# Patient Record
Sex: Female | Born: 1995 | Race: White | Hispanic: No | Marital: Married | Smoking: Never smoker
Health system: Southern US, Community
[De-identification: ages and names within clinical notes are randomized; demographics above are authoritative.]

## PROBLEM LIST (undated history)

## (undated) DIAGNOSIS — Z87898 Personal history of other specified conditions: Secondary | ICD-10-CM

## (undated) DIAGNOSIS — G43909 Migraine, unspecified, not intractable, without status migrainosus: Secondary | ICD-10-CM

## (undated) HISTORY — DX: Personal history of other specified conditions: Z87.898

## (undated) HISTORY — PX: WISDOM TOOTH EXTRACTION: SHX21

## (undated) HISTORY — DX: Migraine, unspecified, not intractable, without status migrainosus: G43.909

---

## 2017-08-17 ENCOUNTER — Encounter: Payer: Self-pay | Admitting: Family Medicine

## 2017-08-17 ENCOUNTER — Ambulatory Visit (INDEPENDENT_AMBULATORY_CARE_PROVIDER_SITE_OTHER): Payer: PRIVATE HEALTH INSURANCE | Admitting: Family Medicine

## 2017-08-17 VITALS — BP 112/68 | HR 97 | Temp 98.8°F | Resp 12 | Ht 67.5 in | Wt 176.9 lb

## 2017-08-17 DIAGNOSIS — L568 Other specified acute skin changes due to ultraviolet radiation: Secondary | ICD-10-CM | POA: Diagnosis not present

## 2017-08-17 DIAGNOSIS — Z833 Family history of diabetes mellitus: Secondary | ICD-10-CM | POA: Diagnosis not present

## 2017-08-17 DIAGNOSIS — J029 Acute pharyngitis, unspecified: Secondary | ICD-10-CM | POA: Diagnosis not present

## 2017-08-17 DIAGNOSIS — H04123 Dry eye syndrome of bilateral lacrimal glands: Secondary | ICD-10-CM

## 2017-08-17 DIAGNOSIS — J3081 Allergic rhinitis due to animal (cat) (dog) hair and dander: Secondary | ICD-10-CM

## 2017-08-17 DIAGNOSIS — Z8269 Family history of other diseases of the musculoskeletal system and connective tissue: Secondary | ICD-10-CM

## 2017-08-17 DIAGNOSIS — R0989 Other specified symptoms and signs involving the circulatory and respiratory systems: Secondary | ICD-10-CM

## 2017-08-17 LAB — POCT RAPID STREP A (OFFICE): Rapid Strep A Screen: NEGATIVE

## 2017-08-17 NOTE — Assessment & Plan Note (Signed)
Checking ANA

## 2017-08-17 NOTE — Assessment & Plan Note (Signed)
Offered basic testing here vs more definitive, thorough testing; she opted for basic testing

## 2017-08-17 NOTE — Progress Notes (Signed)
BP 112/68   Pulse 97   Temp 98.8 F (37.1 C) (Oral)   Resp 12   Ht 5' 7.5" (1.715 m)   Wt 176 lb 14.4 oz (80.2 kg)   SpO2 95%   BMI 27.30 kg/m    Subjective:    Patient ID: Meredith Kelly, female    DOB: Oct 23, 1995, 22 y.o.   MRN: [id number]  HPI: Meredith Kelly is a 22 y.o. female  Chief Complaint  Patient presents with  . New Patient (Initial Visit)  . URI    just got over sickness. symptoms include congestion, cough, irritation in eye, low grade fever.  Onset 3 days    HPI Patient is here to establish care Last month she got antibiotics First time on antibiotics since wisdom teeth Started working out recently Sleeping more when tired Childhood vaccines UTD Did not get any vaccines since age 22 No exposures with homeless, prison population  She has been concerned about diabetes; patient does not eat or drink a lot of sugary Strong family hx of diabetes, both type 1 and type 2; trying to avoid sugar No sodas, no sweets; no dry mouth, no real blurred vision; bad sensitivity to light; has not seen an eye doctor, but mother says she has been to three different eye doctor; all three said she needs OTC reading glasses  Family hx of lupus; she is sick if she gets around someone who is sick; swelling in lymph nodes under throat and under arms; toes turn purple and feel cold   She would like to have allergy testing done; allergic to animal dander  Depression screen PHQ 2/9 08/17/2017  Decreased Interest 0  Down, Depressed, Hopeless 0  PHQ - 2 Score 0   Relevant past medical, surgical, family and social history reviewed History reviewed. No pertinent past medical history.   Past Surgical History:  Procedure Laterality Date  . WISDOM TOOTH EXTRACTION     Family History  Problem Relation Age of Onset  . Lupus Maternal Grandmother   . Diabetes Maternal Grandmother   . Alzheimer's disease Paternal Grandfather    Social History   Tobacco Use  . Smoking status:  Never Smoker  . Smokeless tobacco: Never Used  Substance Use Topics  . Alcohol use: Never    Frequency: Never  . Drug use: Not Currently    Interim medical history since last visit reviewed. Allergies and medications reviewed  Review of Systems Per HPI unless specifically indicated above     Objective:    BP 112/68   Pulse 97   Temp 98.8 F (37.1 C) (Oral)   Resp 12   Ht 5' 7.5" (1.715 m)   Wt 176 lb 14.4 oz (80.2 kg)   SpO2 95%   BMI 27.30 kg/m   Wt Readings from Last 3 Encounters:  08/17/17 176 lb 14.4 oz (80.2 kg)    Physical Exam  Constitutional: She appears well-developed and well-nourished. No distress.  HENT:  Head: Normocephalic and atraumatic.  Right Ear: Ear canal normal. A middle ear effusion (dull) is present.  Left Ear: Tympanic membrane and ear canal normal.  Eyes: EOM are normal. Right eye exhibits no exudate. Left eye exhibits no exudate. Right conjunctiva is injected. Right conjunctiva has no hemorrhage. Left conjunctiva is not injected. Left conjunctiva has no hemorrhage. No scleral icterus.  Neck: No thyromegaly present.  Cardiovascular: Normal rate, regular rhythm and normal heart sounds.  No murmur heard. Toes cool and slightly violaceous, blanching after crossing  legs for a few minutes  Pulmonary/Chest: Effort normal and breath sounds normal. No respiratory distress. She has no wheezes.  Abdominal: Soft. Bowel sounds are normal. She exhibits no distension.  Musculoskeletal: Normal range of motion. She exhibits no edema.  Lymphadenopathy:    She has no cervical adenopathy.       Right cervical: No superficial cervical and no posterior cervical adenopathy present.      Left cervical: No superficial cervical and no posterior cervical adenopathy present.  Neurological: She is alert. She exhibits normal muscle tone.  Skin: Skin is warm and dry. She is not diaphoretic. No pallor.  Psychiatric: She has a normal mood and affect. Her behavior is normal.  Judgment and thought content normal. Her mood appears not anxious. She does not exhibit a depressed mood.    Results for orders placed or performed in visit on 08/17/17  POCT rapid strep A  Result Value Ref Range   Rapid Strep A Screen Negative Negative      Assessment & Plan:   Problem List Items Addressed This Visit      Cardiovascular and Mediastinum   Decreased circulation in fingers or toes    Checking ANA      Relevant Orders   ANA,IFA RA Diag Pnl w/rflx Tit/Patn     Respiratory   Allergic rhinitis due to animal hair and dander    Offered basic testing here vs more definitive, thorough testing; she opted for basic testing      Relevant Orders   Allergy Panel, Animal Group   Allergy Panel, Region 2, [place]asses    Other Visit Diagnoses    Acute pharyngitis, unspecified etiology    -  Primary   rapid strep negative; culture pending; energy is good, so doubt mono or CMV; will check CBC   Relevant Orders   POCT rapid strep A (Completed)   Culture, Group A Strep   CBC with Differential/Platelet   COMPLETE METABOLIC PANEL WITH GFR   Dry eyes       checking ANA; mouth does not appear dry, so will hold on anti-SSA, -SSB   Relevant Orders   Ambulatory referral to Ophthalmology   Photosensitivity       refer to ophthalmologist   Relevant Orders   Ambulatory referral to Ophthalmology   Family history of diabetes mellitus       check glucose   Family history of systemic lupus erythematosus       patient wants testing done, will get ANA with reflexes       Follow up plan: Return in about 2 months (around 10/18/2017) for complete physical.  An after-visit summary was printed and given to the patient at check-out.  Please see the patient instructions which may contain other information and recommendations beyond what is mentioned above in the assessment and plan.  No orders of the defined types were placed in this encounter.   Orders Placed This Encounter  Procedures    . Culture, Group A Strep  . CBC with Differential/Platelet  . COMPLETE METABOLIC PANEL WITH GFR  . ANA,IFA RA Diag Pnl w/rflx Tit/Patn  . Allergy Panel, Animal Group  . Allergy Panel, Region 2, [place]  . Ambulatory referral to Ophthalmology  . POCT rapid strep A

## 2017-08-17 NOTE — Patient Instructions (Addendum)
We'll get labs today If you have not heard anything from my staff in a week about any orders/referrals/studies from today, please contact us here to follow-up [phone]  Pharyngitis Pharyngitis is a sore throat (pharynx). There is redness, pain, and swelling of your throat. Follow these instructions at home:  Drink enough fluids to keep your pee (urine) clear or pale yellow.  Only take medicine as told by your doctor. ? You may get sick again if you do not take medicine as told. Finish your medicines, even if you start to feel better. ? Do not take aspirin.  Rest.  Rinse your mouth (gargle) with salt water ( tsp of salt per 1 qt of water) every 1-2 hours. This will help the pain.  If you are not at risk for choking, you can suck on hard candy or sore throat lozenges. Contact a doctor if:  You have large, tender lumps on your neck.  You have a rash.  You cough up green, yellow-brown, or bloody spit. Get help right away if:  You have a stiff neck.  You drool or cannot swallow liquids.  You throw up (vomit) or are not able to keep medicine or liquids down.  You have very bad pain that does not go away with medicine.  You have problems breathing (not from a stuffy nose). This information is not intended to replace advice given to you by your health care provider. Make sure you discuss any questions you have with your health care provider. Document Released: 06/24/2007 Document Revised: 06/13/2015 Document Reviewed: 09/12/2012 Elsevier Interactive Patient Education  2017 [employer]

## 2017-08-20 LAB — CBC WITH DIFFERENTIAL/PLATELET
Basophils Absolute: 42 cells/uL (ref 0–200)
Basophils Relative: 0.4 %
EOS PCT: 0.8 %
Eosinophils Absolute: 85 cells/uL (ref 15–500)
HCT: 41.3 % (ref 35.0–45.0)
HEMOGLOBIN: 13.5 g/dL (ref 11.7–15.5)
Lymphs Abs: 2544 cells/uL (ref 850–3900)
MCH: 28.1 pg (ref 27.0–33.0)
MCHC: 32.7 g/dL (ref 32.0–36.0)
MCV: 86 fL (ref 80.0–100.0)
MONOS PCT: 6 %
MPV: 9.4 fL (ref 7.5–12.5)
NEUTROS ABS: 7293 {cells}/uL (ref 1500–7800)
Neutrophils Relative %: 68.8 %
Platelets: 302 10*3/uL (ref 140–400)
RBC: 4.8 10*6/uL (ref 3.80–5.10)
RDW: 12.9 % (ref 11.0–15.0)
Total Lymphocyte: 24 %
WBC mixed population: 636 cells/uL (ref 200–950)
WBC: 10.6 10*3/uL (ref 3.8–10.8)

## 2017-08-20 LAB — ALLERGY PANEL, REGION 2, GRASSES
CLASS: 0
CLASS: 0
CLASS: 0
CLASS: 0
Class: 0
G005 Rye, Perennial: <0.1 kU/L
ORCHARD GRASS (COCKSFOOT) (G3) IGE: <0.1 kU/L

## 2017-08-20 LAB — COMPLETE METABOLIC PANEL WITH GFR
AG RATIO: 1.7 (calc) (ref 1.0–2.5)
ALKALINE PHOSPHATASE (APISO): 77 U/L (ref 33–115)
ALT: 14 U/L (ref 6–29)
AST: 13 U/L (ref 10–30)
Albumin: 4.5 g/dL (ref 3.6–5.1)
BILIRUBIN TOTAL: 0.4 mg/dL (ref 0.2–1.2)
BUN: 8 mg/dL (ref 7–25)
CHLORIDE: 103 mmol/L (ref 98–110)
CO2: 30 mmol/L (ref 20–32)
Calcium: 10 mg/dL (ref 8.6–10.2)
Creat: 0.73 mg/dL (ref 0.50–1.10)
GFR, EST AFRICAN AMERICAN: 137 mL/min/{1.73_m2} (ref >=60)
GFR, Est Non African American: 119 mL/min/{1.73_m2} (ref >=60)
Globulin: 2.6 g/dL (calc) (ref 1.9–3.7)
Glucose, Bld: 97 mg/dL (ref 65–139)
POTASSIUM: 4 mmol/L (ref 3.5–5.3)
Sodium: 139 mmol/L (ref 135–146)
TOTAL PROTEIN: 7.1 g/dL (ref 6.1–8.1)

## 2017-08-20 LAB — ALLERGY PANEL, ANIMAL GROUP
Allergen,Goose feathers, e70: <0.1 kU/L
CLASS: 0
CLASS: 0
CLASS: 0
CLASS: 0
Class: 0
Cow Dander IgE: <0.1 kU/L
Horse dander: <0.1 kU/L

## 2017-08-20 LAB — CULTURE, GROUP A STREP
MICRO NUMBER:: 90901717
SPECIMEN QUALITY: ADEQUATE

## 2017-08-20 LAB — ANTI-NUCLEAR AB-TITER (ANA TITER)

## 2017-08-20 LAB — ANA,IFA RA DIAG PNL W/RFLX TIT/PATN
ANA: POSITIVE — AB
Cyclic Citrullin Peptide Ab: <16 UNITS
Rhuematoid fact SerPl-aCnc: <14 IU/mL (ref <14)

## 2017-08-20 LAB — INTERPRETATION:

## 2017-08-27 ENCOUNTER — Telehealth: Payer: Self-pay | Admitting: Family Medicine

## 2017-08-27 DIAGNOSIS — R7689 Other specified abnormal immunological findings in serum: Secondary | ICD-10-CM

## 2017-08-27 DIAGNOSIS — H04123 Dry eye syndrome of bilateral lacrimal glands: Secondary | ICD-10-CM

## 2017-08-27 DIAGNOSIS — R768 Other specified abnormal immunological findings in serum: Secondary | ICD-10-CM | POA: Insufficient documentation

## 2017-08-27 DIAGNOSIS — R0989 Other specified symptoms and signs involving the circulatory and respiratory systems: Secondary | ICD-10-CM

## 2017-08-27 NOTE — Telephone Encounter (Signed)
I talked with patient about final labs; ANA positive, two different patterns; dry eyes, may be sjogrens, refer to rheum for full work-up other testing

## 2017-08-27 NOTE — Assessment & Plan Note (Signed)
Refer to rheum 

## 2017-09-09 ENCOUNTER — Emergency Department
Admission: EM | Admit: 2017-09-09 | Discharge: 2017-09-09 | Disposition: A | Discharge status: Home / Self Care | Payer: Self-pay | Attending: Emergency Medicine | Admitting: Emergency Medicine

## 2017-09-09 ENCOUNTER — Emergency Department: Payer: Self-pay

## 2017-09-09 ENCOUNTER — Encounter: Payer: Self-pay | Admitting: Family Medicine

## 2017-09-09 ENCOUNTER — Ambulatory Visit (INDEPENDENT_AMBULATORY_CARE_PROVIDER_SITE_OTHER): Payer: PRIVATE HEALTH INSURANCE | Admitting: Family Medicine

## 2017-09-09 ENCOUNTER — Ambulatory Visit: Payer: Self-pay | Admitting: *Deleted

## 2017-09-09 VITALS — BP 104/68 | HR 135 | Temp 100.6°F | Ht 67.5 in | Wt 176.0 lb

## 2017-09-09 DIAGNOSIS — R509 Fever, unspecified: Secondary | ICD-10-CM

## 2017-09-09 DIAGNOSIS — R000 Tachycardia, unspecified: Secondary | ICD-10-CM

## 2017-09-09 DIAGNOSIS — N390 Urinary tract infection, site not specified: Secondary | ICD-10-CM | POA: Insufficient documentation

## 2017-09-09 DIAGNOSIS — Z79899 Other long term (current) drug therapy: Secondary | ICD-10-CM | POA: Insufficient documentation

## 2017-09-09 LAB — CBC WITH DIFFERENTIAL/PLATELET
Basophils Absolute: 0.1 10*3/uL (ref 0–0.1)
Basophils Relative: 0 %
EOS PCT: 0 %
Eosinophils Absolute: 0 10*3/uL (ref 0–0.7)
HCT: 38.9 % (ref 35.0–47.0)
Hemoglobin: 13 g/dL (ref 12.0–16.0)
LYMPHS ABS: 1.2 10*3/uL (ref 1.0–3.6)
Lymphocytes Relative: 6 %
MCH: 28.6 pg (ref 26.0–34.0)
MCHC: 33.5 g/dL (ref 32.0–36.0)
MCV: 85.5 fL (ref 80.0–100.0)
MONOS PCT: 7 %
Monocytes Absolute: 1.4 10*3/uL — ABNORMAL HIGH (ref 0.2–0.9)
NEUTROS PCT: 87 %
Neutro Abs: 18.7 10*3/uL — ABNORMAL HIGH (ref 1.4–6.5)
Platelets: 358 10*3/uL (ref 150–440)
RBC: 4.55 MIL/uL (ref 3.80–5.20)
RDW: 14.3 % (ref 11.5–14.5)
WBC: 21.4 10*3/uL — AB (ref 3.6–11.0)

## 2017-09-09 LAB — URINALYSIS, COMPLETE (UACMP) WITH MICROSCOPIC
BILIRUBIN URINE: NEGATIVE
Glucose, UA: NEGATIVE mg/dL
KETONES UR: NEGATIVE mg/dL
NITRITE: POSITIVE — AB
PROTEIN: NEGATIVE mg/dL
Specific Gravity, Urine: 1.008 (ref 1.005–1.030)
WBC, UA: >50 WBC/hpf — ABNORMAL HIGH (ref 0–5)
pH: 6 (ref 5.0–8.0)

## 2017-09-09 LAB — PROTIME-INR
INR: 1.04
Prothrombin Time: 13.5 seconds (ref 11.4–15.2)

## 2017-09-09 LAB — COMPREHENSIVE METABOLIC PANEL
ALBUMIN: 4.4 g/dL (ref 3.5–5.0)
ALK PHOS: 67 U/L (ref 38–126)
ALT: 24 U/L (ref 0–44)
AST: 24 U/L (ref 15–41)
Anion gap: 9 (ref 5–15)
BUN: 13 mg/dL (ref 6–20)
CHLORIDE: 103 mmol/L (ref 98–111)
CO2: 24 mmol/L (ref 22–32)
CREATININE: 0.88 mg/dL (ref 0.44–1.00)
Calcium: 9.7 mg/dL (ref 8.9–10.3)
GFR calc non Af Amer: >60 mL/min (ref >60)
GLUCOSE: 105 mg/dL — AB (ref 70–99)
Potassium: 3.5 mmol/L (ref 3.5–5.1)
SODIUM: 136 mmol/L (ref 135–145)
Total Bilirubin: 1 mg/dL (ref 0.3–1.2)
Total Protein: 8.5 g/dL — ABNORMAL HIGH (ref 6.5–8.1)

## 2017-09-09 LAB — LACTIC ACID, PLASMA
Lactic Acid, Venous: 1.1 mmol/L (ref 0.5–1.9)
Lactic Acid, Venous: 2.1 mmol/L (ref 0.5–1.9)

## 2017-09-09 LAB — HCG, QUANTITATIVE, PREGNANCY

## 2017-09-09 MED ORDER — CEPHALEXIN 500 MG PO CAPS: 500.0000 mg | ORAL_CAPSULE | Freq: Two times a day (BID) | ORAL | 0 refills | Status: AC

## 2017-09-09 MED ORDER — ACETAMINOPHEN 325 MG PO TABS
650.0000 mg | ORAL_TABLET | Freq: Once | ORAL | Status: AC | PRN
  Administered 2017-09-09: 650 mg via ORAL
  Filled 2017-09-09: qty 2

## 2017-09-09 MED ORDER — SODIUM CHLORIDE 0.9 % IV BOLUS
1000.0000 mL | Freq: Once | INTRAVENOUS | Status: AC
  Administered 2017-09-09: 1000 mL via INTRAVENOUS

## 2017-09-09 MED ORDER — SODIUM CHLORIDE 0.9 % IV SOLN
1.0000 g | Freq: Once | INTRAVENOUS | Status: AC
  Administered 2017-09-09: 1 g via INTRAVENOUS
  Filled 2017-09-09: qty 10

## 2017-09-09 NOTE — ED Triage Notes (Signed)
Pt was seen at her PCP about a mth ago for the same thing, they did blood work then. Pt ANA was positive for 2 traits of autoimmune. Pt has an appt with Rheumatoid MD next thursday 09/16/17. This is the 3rd this pt has had a fever in the last 20 days. Pt denies any pain at this time.

## 2017-09-09 NOTE — ED Provider Notes (Signed)
[facility] Emergency Department Provider Note ____________________________________________   First MD Initiated Contact with Patient 09/09/17 [phone]     (approximate)  I have reviewed the triage vital signs and the nursing notes.   HISTORY  Chief Complaint Fever    HPI Meredith Kelly is a 22 y.o. female with no significant past medical history who presents with fever, gradual onset today, and measured to as high as 104 at home.  The patient has had some fatigue and feels slightly weak, but denies any other focal symptoms.  She went to see her primary care doctor and was sent to the emergency department because her heart rate was in the 150s and there was apparent concern for sepsis.  The patient reports that she has had intermittent low-grade fevers over about the last year, with some higher fevers in the last several weeks.  She is currently being worked up for autoimmune disease and had a positive ANA recently.  She has been referred to see a rheumatologist.  No past medical history on file.  Patient Active Problem List   Diagnosis Date Noted  . ANA positive 08/27/2017  . Dry eyes 08/27/2017  . Decreased circulation in fingers or toes 08/17/2017  . Allergic rhinitis due to animal hair and dander 08/17/2017    Past Surgical History:  Procedure Laterality Date  . WISDOM TOOTH EXTRACTION      Prior to Admission medications   Medication Sig Start Date End Date Taking? Authorizing Provider  cetirizine (ZYRTEC) 10 MG tablet Take 10 mg by mouth daily as needed for allergies.   Yes [provider]  Cranberry 425 MG CAPS Take 1 capsule by mouth daily.   Yes [provider]  etonogestrel (NEXPLANON) 68 MG IMPL implant 1 each by Subdermal route once.   Yes [provider]  GARLIC PO Take 1 Dose by mouth daily.   Yes [provider]  Multiple Vitamin (MULTIVITAMIN WITH MINERALS) TABS tablet Take 1 tablet by mouth daily.   Yes  [provider]  cephALEXin (KEFLEX) 500 MG capsule Take 1 capsule (500 mg total) by mouth 2 (two) times daily for 10 days. 09/10/17 09/20/17  Dionne BucySiadecki, Aarin Bluett, MD    Allergies Patient has no known allergies.  Family History  Problem Relation Age of Onset  . Lupus Maternal Grandmother   . Diabetes Maternal Grandmother   . Alzheimer's disease Paternal Grandfather     Social History Social History   Tobacco Use  . Smoking status: Never Smoker  . Smokeless tobacco: Never Used  Substance Use Topics  . Alcohol use: Never    Frequency: Never  . Drug use: Not Currently    Review of Systems Constitutional: Positive for fever. Eyes: No redness. ENT: No sore throat. Cardiovascular: Denies chest pain. Respiratory: Denies shortness of breath or cough. Gastrointestinal: No vomiting.  No diarrhea.  Genitourinary: Negative for dysuria.  Musculoskeletal: Negative for back pain. Skin: Negative for rash. Neurological: Negative for headache.   ____________________________________________   PHYSICAL EXAM:  VITAL SIGNS: ED Triage Vitals  Enc Vitals Group     BP 09/09/17 1558 131/76     Pulse Rate 09/09/17 1554 (!) 129     Resp --      Temp 09/09/17 1554 (!) 101.4 F (38.6 C)     Temp Source 09/09/17 1554 Oral     SpO2 09/09/17 1554 99 %     Weight 09/09/17 1555 175 lb 15.9 oz (79.8 kg)  Height 09/09/17 1555 5\' 7"  (1.702 m)     Head Circumference --      Peak Flow --      Pain Score 09/09/17 1555 0     Pain Loc --      Pain Edu? --      Excl. in GC? --     Constitutional: Alert and oriented. Well appearing and in no acute distress. Eyes: Conjunctivae are normal.  Head: Atraumatic. Nose: No congestion/rhinnorhea. Mouth/Throat: Mucous membranes are slightly dry.   Neck: Normal range of motion.  Cardiovascular: Tachycardic, regular rhythm. Grossly normal heart sounds.  Good peripheral circulation. Respiratory: Normal respiratory effort.  No retractions. Lungs  CTAB. Gastrointestinal: Soft and nontender. No distention.  Genitourinary: No flank tenderness. Musculoskeletal: No lower extremity edema.  Extremities warm and well perfused.  Neurologic:  Normal speech and language. No gross focal neurologic deficits are appreciated.  Skin:  Skin is warm and dry. No rash noted. Psychiatric: Mood and affect are normal. Speech and behavior are normal.  ____________________________________________   LABS (all labs ordered are listed, but only abnormal results are displayed)  Labs Reviewed  COMPREHENSIVE METABOLIC PANEL - Abnormal; Notable for the following components:      Result Value   Glucose, Bld 105 (*)    Total Protein 8.5 (*)    All other components within normal limits  LACTIC ACID, PLASMA - Abnormal; Notable for the following components:   Lactic Acid, Venous 2.1 (*)    All other components within normal limits  CBC WITH DIFFERENTIAL/PLATELET - Abnormal; Notable for the following components:   WBC 21.4 (*)    Neutro Abs 18.7 (*)    Monocytes Absolute 1.4 (*)    All other components within normal limits  URINALYSIS, COMPLETE (UACMP) WITH MICROSCOPIC - Abnormal; Notable for the following components:   Color, Urine YELLOW (*)    APPearance HAZY (*)    Hgb urine dipstick SMALL (*)    Nitrite POSITIVE (*)    Leukocytes, UA LARGE (*)    WBC, UA >50 (*)    Bacteria, UA MANY (*)    All other components within normal limits  GROUP A STREP BY PCR  CULTURE, BLOOD (ROUTINE X 2)  CULTURE, BLOOD (ROUTINE X 2)  PROTIME-INR  HCG, QUANTITATIVE, PREGNANCY  LACTIC ACID, PLASMA  POC URINE PREG, ED   ____________________________________________  EKG  ED ECG REPORT I, Dionne Bucy, the attending physician, personally viewed and interpreted this ECG.  Date: 09/09/2017 EKG Time: 1621 Rate: 108 Rhythm: Sinus tachycardia QRS Axis: normal Intervals: normal ST/T Wave abnormalities: normal Narrative Interpretation: no evidence of acute  ischemia  ____________________________________________  RADIOLOGY  CXR: No focal infiltrate or other acute abnormality  ____________________________________________   PROCEDURES  Procedure(s) performed: No  Procedures  Critical Care performed: No ____________________________________________   INITIAL IMPRESSION / ASSESSMENT AND PLAN / ED COURSE  Pertinent labs & imaging results that were available during my care of the patient were reviewed by me and considered in my medical decision making (see chart for details).  22 year old female with no significant past medical history (but with positive ANA and currently being worked up for possible autoimmune disease) presents with fever today, to as high as 104, with associated tachycardia.  The patient reports some fatigue and aches, but denies any other focal symptoms to suggest a source of infection.  She has had similar fevers several times recently.  I reviewed the past medical records including PCP Dr. Marlise Eves recent notes, and confirmed  the history of positive ANA and referral to rheumatology.  On exam, the patient is actually quite well-appearing.  She is febrile and slightly tachycardic, but her other vital signs are normal.  The remainder of the exam is unremarkable.  The primary differential would be viral syndrome, versus some type of autoimmune etiology.  I suspect that the tachycardia is primarily a response to the fever rather than dehydration or sepsis.  However, given her abnormal vital signs we will obtain full infection/sepsis work-up and give fluids.  If the work-up is entirely negative and the patient's vital signs improved, anticipate discharge home with outpatient follow-up.  ----------------------------------------- 9:57 PM on 09/09/2017 -----------------------------------------  The patient's initial work-up revealed elevated WBC count, and minimally elevated lactate of 2.1.  Her vital signs improved after Tylenol  and fluids.  Chest x-ray was negative,, but the UA is consistent with UTI.  Although the patient has no significant urinary symptoms, given that she has numerous WBCs, as well as nitrites and leukocytes, this is consistent with infection and I suspect the most likely etiology of her febrile illness.  We gave additional fluids and observe the patient.  Repeat lactate was 1.1 (the second lactate was actually resulted as an earlier sample, which is incorrect).   The patient feels much better and would like to go home.  Given that her vital signs have significantly improved, there is no evidence of active sepsis, and we have found a source of infection, she is appropriate for discharge home.  We gave a dose of ceftriaxone in the ED.  I will continue the patient on Keflex at home.  She has follow-up with rheumatology next week.  I also instructed her to follow-up with her primary care doctor.  I had an extensive discussion about the results of the work-up and the plan of care.  Return precautions given, and she and her mother expressed understanding. ____________________________________________   FINAL CLINICAL IMPRESSION(S) / ED DIAGNOSES  Final diagnoses:  Urinary tract infection without hematuria, site unspecified  Febrile illness      NEW MEDICATIONS STARTED DURING THIS VISIT:  New Prescriptions   CEPHALEXIN (KEFLEX) 500 MG CAPSULE    Take 1 capsule (500 mg total) by mouth 2 (two) times daily for 10 days.     Note:  This document was prepared using Dragon voice recognition software and may include unintentional dictation errors.    Dionne Bucy, MD 09/09/17 2200

## 2017-09-09 NOTE — ED Notes (Signed)
Received call from X-ray that patient refused. MD notified.

## 2017-09-09 NOTE — ED Triage Notes (Signed)
Pt presents for fever from [facility] for fever and hr. Pt states there her HR was 154 standing. Pt is NAD A/O at this time with mother present.

## 2017-09-09 NOTE — Progress Notes (Signed)
Name: Meredith Kelly   MRN: [id number]    DOB: 10-Feb-1995   Date:09/09/2017       Progress Note  Subjective  Chief Complaint  Chief Complaint  Patient presents with  . Fever  . Tachycardia    HPI  Pt presents to clinic with concern for fever - she finished her college classes this afternoon, walked outside, and started to feel warm/fatigued.  Took her temperature and it was 101F, took some tylenol, rechecked her temp and it was 103F, 1 hour later is was 104F, so she took advil and came to our clinic for evaluation.  Temp Is 100.6 in clinic today.  She denies any other symptoms and states she does not feel badly aside from feeling like her heart is racing and some fatigue.    She did have fever and acute non-streptococcal pharyngitis about 20 days ago when she established care with our clinic with PCP Dr. Sherie Kelly. The labs from that visit are reviewed - she had postiive ANA, and normal CBC and CMP.    Patient Active Problem List   Diagnosis Date Noted  . ANA positive 08/27/2017  . Dry eyes 08/27/2017  . Decreased circulation in fingers or toes 08/17/2017  . Allergic rhinitis due to animal hair and dander 08/17/2017    Social History   Tobacco Use  . Smoking status: Never Smoker  . Smokeless tobacco: Never Used  Substance Use Topics  . Alcohol use: Never    Frequency: Never    No current outpatient medications on file.  No Known Allergies  ROS  Constitutional: Positive for fever; weight change.  HEENT: No throat pain, ear pain, or sinus congestion Respiratory: Negative for cough and shortness of breath.   Cardiovascular: Negative for chest pain or palpitations.  Gastrointestinal: Negative for abdominal pain, no bowel changes.  Musculoskeletal: Negative for gait problem or joint swelling.  Skin: Negative for rash.  Neurological: Negative for dizziness or headache.  Urologic: No flank pain, back pain, dysuria, urinary frequency or urgency No other specific complaints in  a complete review of systems (except as listed in HPI above).   Objective  Vitals:   09/09/17 1508  BP: 104/68  Pulse: (!) 135  Temp: (!) 100.6 F (38.1 C)  SpO2: 99%  Weight: 176 lb (79.8 kg)  Height: 5' 7.5" (1.715 m)   HR is 140-145 auscultated  Body mass index is 27.16 kg/m.  Nursing Note and Vital Signs reviewed.  Physical Exam  Constitutional: Patient appears well-developed and well-nourished. No distress.  HENT: Head: Normocephalic and atraumatic. Ears: B TMs ok, no erythema or effusion; Nose: Nose normal. Mouth/Throat: Oropharynx is clear and moist. No oropharyngeal exudate.  Eyes: Conjunctivae and EOM are normal. Pupils are equal, round, and reactive to light. No scleral icterus.  Neck: Normal range of motion. Neck with normal AROM. No JVD present.  Cardiovascular: Normal rate, regular rhythm and normal heart sounds.  No murmur heard. No BLE edema. Pulmonary/Chest: Effort normal and breath sounds normal. No respiratory distress. Musculoskeletal: Normal range of motion, no joint effusions. No gross deformities Neurological: she is alert and oriented to person, place, and time. No cranial nerve deficit. Coordination, balance, strength, speech and gait are normal.  Skin: Skin is warm and dry. No rash noted. No erythema.  Psychiatric: Patient has a normal mood and affect. behavior is normal. Judgment and thought content normal.  No results found for this or any previous visit (from the past 72 hour(s)).  Assessment & Plan  1. Fever of unknown origin (FUO) 2. Tachycardia - There is no obvious source of infection, and patient's heart rate is notably elevated. I advised she needs to present to the ER for further evaluation.  She is agreeable but declines EMS transport - has a family friend who will drive her.  - PCP Dr. Sherie Kelly is made aware of patient presentation and plan and is agreeable.

## 2017-09-09 NOTE — ED Notes (Signed)
First encounter with patient. Patient resting in bed. Monitor in place. IV fluids infusing.

## 2017-09-09 NOTE — Telephone Encounter (Signed)
Pt reports temp at 1200 noon 101.0; states took tylenol and Ibuprofen at that time.. At 1345 temp 103.0. Pt states then used ice packs; Temp at 1410... 104.2. Temp obtained by oral digital thermometer. Denies any symptoms other than chills; some intermittent nausea "When first went outside into the heat."  No vomiting. Per Dr. Marlise EvesLada's note of 08/27/17,  pt. ANA positive. Pt has an appt with rheumatologist next week. NT placed call to practice; questioned disposition of UC/ED or appropriate for pt to be seen in office; spoke with Cassandra.  Appt secured with Dr. Sherie DonLada for 1500 this afternoon. Care advise given per protocol.  Reason for Disposition . Fever > 104 F (40 C)  Answer Assessment - Initial Assessment Questions 1. TEMPERATURE: "What is the most recent temperature?"  "How was it measured?"      104.2 digital thermometer. 2. ONSET: "When did the fever start?"     1200 noon 3. SYMPTOMS: "Do you have any other symptoms besides the fever?"  (e.g., colds, headache, sore throat, earache, cough, rash, diarrhea, vomiting, abdominal pain)     no 4. CAUSE: If there are no symptoms, ask: "What do you think is causing the fever?"      Unsure 5. CONTACTS: "Does anyone else in the family have an infection?"     no 6. TREATMENT: "What have you done so far to treat this fever?" (e.g., medications)     Tylenol, ice packs, Ibuprofen. 7. IMMUNOCOMPROMISE: "Do you have of the following: diabetes, HIV positive, splenectomy, cancer chemotherapy, chronic steroid treatment, transplant patient, etc."  Protocols used: FEVER-A-AH

## 2017-09-09 NOTE — Discharge Instructions (Addendum)
Take the antibiotic as prescribed and finish the full course.  You can continue to take ibuprofen or Tylenol as needed for fever.  Follow-up with the rheumatologist next week as scheduled, and with your primary care doctor.  Return to the ER for new, worsening, or persistent high fever, weakness, vomiting or inability to take the medication, abdominal or flank pain, or any other new or worsening symptoms that concern you.

## 2017-09-09 NOTE — ED Notes (Signed)
ED Provider at bedside. 

## 2017-09-09 NOTE — Telephone Encounter (Signed)
Patient sent to the ER

## 2017-09-09 NOTE — ED Notes (Signed)
Pt refused Strep test. States that she doesn't feel like she needs it and that she has been tested for it in the past. MD at bedside.

## 2017-09-09 NOTE — ED Notes (Signed)
Pt given fluid to drink. Pt c/o pain at the iv site on the right arm. Pt says it feels like its stabbing her arm. Offerred to pull the iv and we could restick if she needed the 2nd line. Mom stated that they would wait. No fluids infusing in that line at this time. Pt is laying flat on her back and wont move her arms. Mom is giving pt water through a straw.

## 2017-09-10 LAB — BLOOD CULTURE ID PANEL (REFLEXED)
ACINETOBACTER BAUMANNII: NOT DETECTED
CANDIDA GLABRATA: NOT DETECTED
CANDIDA KRUSEI: NOT DETECTED
CANDIDA PARAPSILOSIS: NOT DETECTED
Candida albicans: NOT DETECTED
Candida tropicalis: NOT DETECTED
ENTEROBACTER CLOACAE COMPLEX: NOT DETECTED
ESCHERICHIA COLI: NOT DETECTED
Enterobacteriaceae species: NOT DETECTED
Enterococcus species: NOT DETECTED
Haemophilus influenzae: NOT DETECTED
KLEBSIELLA OXYTOCA: NOT DETECTED
Klebsiella pneumoniae: NOT DETECTED
Listeria monocytogenes: NOT DETECTED
Methicillin resistance: NOT DETECTED
Neisseria meningitidis: NOT DETECTED
Proteus species: NOT DETECTED
Pseudomonas aeruginosa: NOT DETECTED
SERRATIA MARCESCENS: NOT DETECTED
STAPHYLOCOCCUS AUREUS BCID: NOT DETECTED
STREPTOCOCCUS PNEUMONIAE: NOT DETECTED
STREPTOCOCCUS PYOGENES: NOT DETECTED
Staphylococcus species: DETECTED — AB
Streptococcus agalactiae: NOT DETECTED
Streptococcus species: NOT DETECTED

## 2017-09-10 NOTE — ED Provider Notes (Signed)
Patient with 1 out of 4 positive blood culture, staph species, likely contaminant.  Spoke with patient who reports she is feeling better, she is resting at home and will be picking up her antibiotics soon.  She knows to return if any worsening of her symptoms   Jene EveryKinner, Lillie Bollig, MD 09/10/17 1103

## 2017-09-11 LAB — CULTURE, BLOOD (ROUTINE X 2)

## 2017-09-14 LAB — CULTURE, BLOOD (ROUTINE X 2)
CULTURE: NO GROWTH
Special Requests: ADEQUATE

## 2017-10-06 ENCOUNTER — Other Ambulatory Visit: Payer: Self-pay | Admitting: Internal Medicine

## 2017-10-06 DIAGNOSIS — R509 Fever, unspecified: Secondary | ICD-10-CM

## 2017-10-14 ENCOUNTER — Ambulatory Visit: Payer: Medicaid Other

## 2017-10-15 ENCOUNTER — Encounter: Payer: PRIVATE HEALTH INSURANCE | Admitting: Family Medicine

## 2017-10-26 ENCOUNTER — Ambulatory Visit
Admission: RE | Admit: 2017-10-26 | Discharge: 2017-10-26 | Disposition: A | Discharge status: Home / Self Care | Payer: Medicaid Other | Source: Ambulatory Visit | Attending: Internal Medicine | Admitting: Internal Medicine

## 2017-11-16 ENCOUNTER — Encounter: Payer: PRIVATE HEALTH INSURANCE | Admitting: Family Medicine

## 2017-11-16 ENCOUNTER — Ambulatory Visit (INDEPENDENT_AMBULATORY_CARE_PROVIDER_SITE_OTHER): Payer: PRIVATE HEALTH INSURANCE | Admitting: Nurse Practitioner

## 2017-11-16 ENCOUNTER — Encounter: Payer: Self-pay | Admitting: Nurse Practitioner

## 2017-11-16 VITALS — BP 132/80 | HR 90 | Temp 99.0°F | Resp 16 | Ht 66.0 in | Wt 174.3 lb

## 2017-11-16 DIAGNOSIS — Z0000 Encounter for general adult medical examination without abnormal findings: Secondary | ICD-10-CM | POA: Diagnosis not present

## 2017-11-16 DIAGNOSIS — Z789 Other specified health status: Secondary | ICD-10-CM | POA: Diagnosis not present

## 2017-11-16 LAB — POCT URINALYSIS DIPSTICK
APPEARANCE: NORMAL
BILIRUBIN UA: NEGATIVE
Glucose, UA: NEGATIVE
KETONES UA: NEGATIVE
Leukocytes, UA: NEGATIVE
Nitrite, UA: NEGATIVE
Odor: NORMAL
PH UA: 6 (ref 5.0–8.0)
Protein, UA: NEGATIVE
RBC UA: NEGATIVE
SPEC GRAV UA: 1.02 (ref 1.010–1.025)
UROBILINOGEN UA: 0.2 U/dL

## 2017-11-16 NOTE — Patient Instructions (Signed)
General recommendations: 150 minutes of physical activity weekly, eat two servings of fish weekly, eat one serving of tree nuts ( cashews, pistachios, pecans, almonds..) every other day, eat 6 servings of fruit/vegetables daily and drink plenty of water and avoid sweet beverages.   

## 2017-11-16 NOTE — Progress Notes (Signed)
Name: Meredith Kelly   MRN: [id number]    DOB: 01-12-1996   Date:11/16/2017       Progress Note  Subjective  Chief Complaint  Chief Complaint  Patient presents with  . Annual Exam    HPI  Patient presents for annual CPE.  Last month patient was admitted for severe UTI and almost septic, patient states had foul odor to urine but no other urinary symptoms would like urine screened today. She sees Dr. Annalee Genta for positive ANA, states she was referred to infectious disease by her but was overwhelmed by seeing so many providers and her hospitalization that she hasn't followed up and is feeling much improved.    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 Does not some fatigue states during the week that resolves on the weekends. States that she doesn't get much sleep during the week days. Has good social support, enjoys her job and school but is more stressed with schooling because it is her senior year.   Diet:  Eats 3 times a day,  Breakfast- yogurt, granola Lunch time- eats out Dinner- home-cooked meat and vegetables 3 servings of vegetables a day; 4-5 servings fruits a day Drinks water- 5-6 bottles of water of day Exercise:  3 times a week, an hour- cardio and some pushups and sit up USPSTF grade A and B recommendations    Office Visit from 11/16/2017 in [facility]  AUDIT-C Score  0     Depression:  Depression screen [facility] 2/9 11/16/2017 08/17/2017  Decreased Interest 0 0  Down, Depressed, Hopeless 0 0  PHQ - 2 Score 0 0   Hypertension: BP Readings from Last 3 Encounters:  11/16/17 132/80  09/09/17 118/75  09/09/17 104/68   Obesity: Wt Readings from Last 3 Encounters:  11/16/17 174 lb 4.8 oz (79.1 kg)  09/09/17 175 lb 15.9 oz (79.8 kg)  09/09/17 176 lb (79.8 kg)   BMI Readings from Last 3 Encounters:  11/16/17 28.13 kg/m  09/09/17  27.56 kg/m  09/09/17 27.16 kg/m    Hep C Screening:  Declines  STD testing and prevention (HIV/chl/gon/syphilis): declines  Intimate partner violence: denies  Sexual History/Pain during Intercourse: denies Menstrual History/LMP/Abnormal Bleeding: has impant; hadnt had a period for over a year and currently having period   Breast cancer:  No family history   Cervical cancer screening: age 22   Glucose:  Glucose, Bld  Date Value Ref Range Status  09/09/2017 105 (H) 70 - 99 mg/dL Final  08/17/2017 97 65 - 139 mg/dL Final    Comment:    .        Non-fasting reference interval .     Skin cancer:    Patient Active Problem List   Diagnosis Date Noted  . ANA positive 08/27/2017  . Dry eyes 08/27/2017  . Decreased circulation in fingers or toes 08/17/2017  . Allergic rhinitis due to animal hair and dander 08/17/2017    Past Surgical History:  Procedure Laterality Date  . WISDOM TOOTH EXTRACTION      Family History  Problem Relation Age of Onset  . Lupus Maternal Grandmother   . Diabetes Maternal Grandmother   . Alzheimer's disease Paternal Grandfather     Social History   Socioeconomic History  . Marital status: Single    Spouse name: Not on file  . Number of children: Not on  file  . Years of education: Not on file  . Highest education level: Not on file  Occupational History  . Not on file  Social Needs  . Financial resource strain: Somewhat hard  . Food insecurity:    Worry: Never true    Inability: Never true  . Transportation needs:    Medical: No    Non-medical: No  Tobacco Use  . Smoking status: Never Smoker  . Smokeless tobacco: Never Used  Substance and Sexual Activity  . Alcohol use: Never    Frequency: Never  . Drug use: Not Currently  . Sexual activity: Not Currently  Lifestyle  . Physical activity:    Days per week: 3 days    Minutes per session: 60 min  . Stress: Rather much  Relationships  . Social connections:    Talks on  phone: Not on file    Gets together: More than three times a week    Attends religious service: More than 4 times per year    Active member of club or organization: Yes    Attends meetings of clubs or organizations: More than 4 times per year    Relationship status: Never married  . Intimate partner violence:    Fear of current or ex partner: No    Emotionally abused: No    Physically abused: No    Forced sexual activity: No  Other Topics Concern  . Not on file  Social History Narrative  . Not on file     Current Outpatient Medications:  .  etonogestrel (NEXPLANON) 68 MG IMPL implant, 1 each by Subdermal route once., Disp: , Rfl:  .  Cranberry 425 MG CAPS, Take 1 capsule by mouth daily., Disp: , Rfl:   No Known Allergies   Review of Systems  Constitutional: Positive for malaise/fatigue. Negative for chills and fever.  HENT: Negative for congestion, sinus pain and sore throat.   Eyes: Negative for blurred vision.  Respiratory: Negative for cough and shortness of breath.   Cardiovascular: Positive for palpitations (mild when she gets anxious). Negative for chest pain and leg swelling.  Gastrointestinal: Negative for abdominal pain, constipation, diarrhea and nausea.  Genitourinary: Negative for dysuria.  Musculoskeletal: Negative for falls and joint pain.  Skin: Negative for rash.  Neurological: Negative for dizziness and headaches.  Endo/Heme/Allergies: Negative for polydipsia.  Psychiatric/Behavioral: Negative for depression. The patient is nervous/anxious. The patient does not have insomnia.      Objective  Vitals:   11/16/17 1454 11/16/17 1532  BP: 132/80   Pulse: (!) 108 90  Resp: 16   Temp: 99 F (37.2 C)   TempSrc: Oral   SpO2: 98% 98%  Weight: 174 lb 4.8 oz (79.1 kg)   Height: 5' 6"  (1.676 m)     Body mass index is 28.13 kg/m.  Physical Exam Constitutional: Patient appears well-developed and well-nourished. No distress.  HENT: Head: Normocephalic and  atraumatic. Ears: B TMs ok, no erythema or effusion; Nose: Nose normal. Mouth/Throat: Oropharynx is clear and moist. No oropharyngeal exudate.  Eyes: Conjunctivae and EOM are normal. Pupils are equal, round, and reactive to light. No scleral icterus.  Neck: Normal range of motion. Neck supple. No JVD present. No thyromegaly present.  Cardiovascular: increased rate, regular rhythm and normal heart sounds.  No murmur heard. No BLE edema. Pulmonary/Chest: Effort normal and breath sounds normal. No respiratory distress. Abdominal: Soft. Bowel sounds are normal, no distension. There is no tenderness. no masses FEMALE GENITALIA: deferred  Musculoskeletal:  Normal range of motion, no joint effusions. No gross deformities Neurological: he is alert and oriented to person, place, and time. No cranial nerve deficit. Coordination, balance, strength, speech and gait are normal.  Skin: Skin is warm and dry. No rash noted. No erythema.  Psychiatric: Patient has a normal mood and affect. behavior is normal. Judgment and thought content normal.     Recent Results (from the past 2160 hour(s))  Comprehensive metabolic panel     Status: Abnormal   Collection Time: 09/09/17  4:30 PM  Result Value Ref Range   Sodium 136 135 - 145 mmol/L   Potassium 3.5 3.5 - 5.1 mmol/L   Chloride 103 98 - 111 mmol/L   CO2 24 22 - 32 mmol/L   Glucose, Bld 105 (H) 70 - 99 mg/dL   BUN 13 6 - 20 mg/dL   Creatinine, Ser 0.88 0.44 - 1.00 mg/dL   Calcium 9.7 8.9 - 10.3 mg/dL   Total Protein 8.5 (H) 6.5 - 8.1 g/dL   Albumin 4.4 3.5 - 5.0 g/dL   AST 24 15 - 41 U/L   ALT 24 0 - 44 U/L   Alkaline Phosphatase 67 38 - 126 U/L   Total Bilirubin 1.0 0.3 - 1.2 mg/dL   GFR calc non Af Amer >60 >60 mL/min   GFR calc Af Amer >60 >60 mL/min    Comment: (NOTE) The eGFR has been calculated using the CKD EPI equation. This calculation has not been validated in all clinical situations. eGFR's persistently <60 mL/min signify possible Chronic  Kidney Disease.    Anion gap 9 5 - 15    Comment: Performed at [facility], [address] [place], [place] [address]  CBC with Differential     Status: Abnormal   Collection Time: 09/09/17  4:30 PM  Result Value Ref Range   WBC 21.4 (H) 3.6 - 11.0 K/uL   RBC 4.55 3.80 - 5.20 MIL/uL   Hemoglobin 13.0 12.0 - 16.0 g/dL   HCT 38.9 35.0 - 47.0 %   MCV 85.5 80.0 - 100.0 fL   MCH 28.6 26.0 - 34.0 pg   MCHC 33.5 32.0 - 36.0 g/dL   RDW 14.3 11.5 - 14.5 %   Platelets 358 150 - 440 K/uL   Neutrophils Relative % 87 %   Neutro Abs 18.7 (H) 1.4 - 6.5 K/uL   Lymphocytes Relative 6 %   Lymphs Abs 1.2 1.0 - 3.6 K/uL   Monocytes Relative 7 %   Monocytes Absolute 1.4 (H) 0.2 - 0.9 K/uL   Eosinophils Relative 0 %   Eosinophils Absolute 0.0 0 - 0.7 K/uL   Basophils Relative 0 %   Basophils Absolute 0.1 0 - 0.1 K/uL    Comment: Performed at [facility], [address] [place], [place] [address]  Protime-INR     Status: None   Collection Time: 09/09/17  4:30 PM  Result Value Ref Range   Prothrombin Time 13.5 11.4 - 15.2 seconds   INR 1.04     Comment: Performed at [facility], [address] [place], [place] [address]  Culture, blood (Routine x 2)     Status: None   Collection Time: 09/09/17  4:30 PM  Result Value Ref Range   Specimen Description BLOOD BLOOD RIGHT FOREARM    Special Requests      BOTTLES DRAWN AEROBIC AND ANAEROBIC Blood Culture adequate volume   Culture      NO GROWTH 5 DAYS Performed  at [facility], [address] [place], [place] [address]    Report Status 09/14/2017 FINAL   hCG, quantitative, pregnancy     Status: None   Collection Time: 09/09/17  4:30 PM  Result Value Ref Range   hCG, Beta Chain, Quant, S <1 <5 mIU/mL    Comment:          GEST. AGE      CONC.  (mIU/mL)   <=1 WEEK        5 - 50     2 WEEKS       50 - 500     3 WEEKS       100 - 10,000     4 WEEKS     1,000 - 30,000     5 WEEKS     3,500 - 115,000    6-8 WEEKS     12,000 - 270,000    12 WEEKS     15,000 - 220,000        FEMALE AND NON-PREGNANT FEMALE:     LESS THAN 5 mIU/mL Performed at [facility], [address] [place], [place] [address]   Culture, blood (Routine x 2)     Status: Abnormal   Collection Time: 09/09/17  4:31 PM  Result Value Ref Range   Specimen Description      BLOOD LEFT ANTECUBITAL Performed at [facility], [address] [place], [place] [address]    Special Requests      BOTTLES DRAWN AEROBIC AND ANAEROBIC Blood Culture results may not be optimal due to an excessive volume of blood received in culture bottles Performed at [facility], [address] [place], [place] [address]    Culture  Setup Time      [place] CRITICAL RESULT CALLED TO, READ BACK BY AND VERIFIED WITH: STEPHANIE RUDD 09/10/17 @ 3557  [place]    Culture (A)     STAPHYLOCOCCUS SPECIES (COAGULASE NEGATIVE) THE SIGNIFICANCE OF ISOLATING THIS ORGANISM FROM A SINGLE SET OF BLOOD CULTURES WHEN MULTIPLE SETS ARE DRAWN IS UNCERTAIN. PLEASE NOTIFY THE MICROBIOLOGY DEPARTMENT WITHIN ONE WEEK IF SPECIATION AND SENSITIVITIES ARE REQUIRED. Performed at [facility], [place] [address], [place], [place] [address]    Report Status 09/11/2017 FINAL   Urinalysis, Complete w Microscopic     Status: Abnormal   Collection Time: 09/09/17  4:31 PM  Result Value Ref Range   Color, Urine YELLOW (A) YELLOW   APPearance HAZY (A) CLEAR   Specific Gravity, Urine 1.008 1.005 - 1.030   pH 6.0 5.0 - 8.0   Glucose, UA NEGATIVE NEGATIVE mg/dL   Hgb urine dipstick SMALL (A) NEGATIVE   Bilirubin Urine NEGATIVE NEGATIVE   Ketones, ur NEGATIVE NEGATIVE mg/dL   Protein, ur NEGATIVE NEGATIVE mg/dL   Nitrite POSITIVE (A) NEGATIVE   Leukocytes, UA LARGE (A) NEGATIVE   RBC / HPF 0-5 0 - 5 RBC/hpf   WBC, UA >50 (H) 0 - 5 WBC/hpf   Bacteria, UA MANY (A) NONE SEEN   Squamous Epithelial / LPF 0-5 0 - 5    Mucus PRESENT     Comment: Performed at [facility], [address] [place], [place] [address]  Lactic acid, plasma     Status: None   Collection Time: 09/09/17  4:31 PM  Result Value Ref Range   Lactic Acid, Venous 1.1 0.5 - 1.9 mmol/L    Comment: Performed at [facility], [place], [place] [address]  Blood Culture ID Panel (Reflexed)     Status: Abnormal   Collection Time: 09/09/17  4:31 PM  Result Value Ref Range   Enterococcus species NOT DETECTED NOT DETECTED   Listeria monocytogenes NOT DETECTED NOT DETECTED   Staphylococcus species DETECTED (A) NOT DETECTED    Comment: Methicillin (oxacillin) susceptible coagulase negative staphylococcus. Possible blood culture contaminant (unless isolated from more than one blood culture draw or clinical case suggests pathogenicity). No antibiotic treatment is indicated for blood  culture contaminants. CRITICAL RESULT CALLED TO, READ BACK BY AND VERIFIED WITH: STEPHANIE RUDD 09/10/17 @ 6979  [place]    Staphylococcus aureus (BCID) NOT DETECTED NOT DETECTED   Methicillin resistance NOT DETECTED NOT DETECTED   Streptococcus species NOT DETECTED NOT DETECTED   Streptococcus agalactiae NOT DETECTED NOT DETECTED   Streptococcus pneumoniae NOT DETECTED NOT DETECTED   Streptococcus pyogenes NOT DETECTED NOT DETECTED   Acinetobacter baumannii NOT DETECTED NOT DETECTED   Enterobacteriaceae species NOT DETECTED NOT DETECTED   Enterobacter cloacae complex NOT DETECTED NOT DETECTED   Escherichia coli NOT DETECTED NOT DETECTED   Klebsiella oxytoca NOT DETECTED NOT DETECTED   Klebsiella pneumoniae NOT DETECTED NOT DETECTED   Proteus species NOT DETECTED NOT DETECTED   Serratia marcescens NOT DETECTED NOT DETECTED   Haemophilus influenzae NOT DETECTED NOT DETECTED   Neisseria meningitidis NOT DETECTED NOT DETECTED   Pseudomonas aeruginosa NOT DETECTED NOT DETECTED   Candida albicans NOT DETECTED NOT DETECTED   Candida  glabrata NOT DETECTED NOT DETECTED   Candida krusei NOT DETECTED NOT DETECTED   Candida parapsilosis NOT DETECTED NOT DETECTED   Candida tropicalis NOT DETECTED NOT DETECTED    Comment: Performed at [facility], [address] [place], [place] [address]  Lactic acid, plasma     Status: Abnormal   Collection Time: 09/09/17  6:04 PM  Result Value Ref Range   Lactic Acid, Venous 2.1 (HH) 0.5 - 1.9 mmol/L    Comment: CRITICAL RESULT CALLED TO, READ BACK BY AND VERIFIED WITH CHRISTINA JAMES @1717  09/09/17 AKT Performed at [facility], [address] [place], [place] [address]        Fall Risk: Fall Risk  11/16/2017 08/17/2017  Falls in the past year? No No     Functional Status Survey: Is the patient deaf or have difficulty hearing?: No Does the patient have difficulty seeing, even when wearing glasses/contacts?: No Does the patient have difficulty concentrating, remembering, or making decisions?: No Does the patient have difficulty walking or climbing stairs?: No Does the patient have difficulty dressing or bathing?: No Does the patient have difficulty doing errands alone such as visiting a doctor's office or shopping?: No   Assessment & Plan  1. Annual physical exam Reviewed blood work done in ED, no anemia, no hyperglycemia, kidney or liver dysfunction noted. Discussed continued follow-up with specialist. Patient has white coat syndrome. - Ambulatory referral to Gynecology - POCT Urinalysis Dipstick  2. Uses birth control Referred to remove implanon  - Ambulatory referral to Gynecology  -USPSTF grade A and B recommendations reviewed with patient; age-appropriate recommendations, preventive care, screening tests, etc discussed and encouraged; healthy living encouraged; see AVS for patient education given to patient -Discussed importance of 150 minutes of physical activity weekly, eat two servings of fish weekly, eat one serving of tree nuts ( cashews,  pistachios, pecans, almonds.[place] Kitchen) every other day, eat 6 servings of fruit/vegetables daily and drink plenty of water  and avoid sweet beverages.

## 2017-11-18 ENCOUNTER — Telehealth: Payer: Self-pay | Admitting: Obstetrics & Gynecology

## 2017-11-18 NOTE — Telephone Encounter (Signed)
[facility] referring for removal of Implanon, establish care. Called and left voicemail for patient to call back to be schedule

## 2017-11-23 NOTE — Telephone Encounter (Signed)
Called and left voice mail for patient to call back to be schedule °

## 2017-11-30 ENCOUNTER — Other Ambulatory Visit: Payer: Self-pay

## 2017-11-30 ENCOUNTER — Encounter: Payer: Self-pay | Admitting: Family Medicine

## 2017-11-30 ENCOUNTER — Encounter: Payer: Self-pay | Admitting: Obstetrics and Gynecology

## 2017-11-30 ENCOUNTER — Emergency Department (HOSPITAL_COMMUNITY): Payer: Self-pay

## 2017-11-30 ENCOUNTER — Emergency Department (HOSPITAL_COMMUNITY)
Admission: EM | Admit: 2017-11-30 | Discharge: 2017-11-30 | Disposition: A | Discharge status: Home / Self Care | Payer: Self-pay | Attending: Emergency Medicine | Admitting: Emergency Medicine

## 2017-11-30 ENCOUNTER — Encounter (HOSPITAL_COMMUNITY): Payer: Self-pay

## 2017-11-30 DIAGNOSIS — Z79899 Other long term (current) drug therapy: Secondary | ICD-10-CM | POA: Insufficient documentation

## 2017-11-30 DIAGNOSIS — N12 Tubulo-interstitial nephritis, not specified as acute or chronic: Secondary | ICD-10-CM | POA: Insufficient documentation

## 2017-11-30 LAB — CBC WITH DIFFERENTIAL/PLATELET
Abs Immature Granulocytes: 0.06 10*3/uL (ref 0.00–0.07)
Basophils Absolute: 0 10*3/uL (ref 0.0–0.1)
Basophils Relative: 0 %
EOS ABS: 0 10*3/uL (ref 0.0–0.5)
EOS PCT: 0 %
HEMATOCRIT: 42.7 % (ref 36.0–46.0)
Hemoglobin: 13.4 g/dL (ref 12.0–15.0)
Immature Granulocytes: 0 %
LYMPHS ABS: 0.7 10*3/uL (ref 0.7–4.0)
Lymphocytes Relative: 5 %
MCH: 27.5 pg (ref 26.0–34.0)
MCHC: 31.4 g/dL (ref 30.0–36.0)
MCV: 87.7 fL (ref 80.0–100.0)
MONO ABS: 1 10*3/uL (ref 0.1–1.0)
MONOS PCT: 6 %
Neutro Abs: 13.6 10*3/uL — ABNORMAL HIGH (ref 1.7–7.7)
Neutrophils Relative %: 89 %
Platelets: 227 10*3/uL (ref 150–400)
RBC: 4.87 MIL/uL (ref 3.87–5.11)
RDW: 13.9 % (ref 11.5–15.5)
WBC: 15.4 10*3/uL — ABNORMAL HIGH (ref 4.0–10.5)
nRBC: 0 % (ref 0.0–0.2)

## 2017-11-30 LAB — URINALYSIS, ROUTINE W REFLEX MICROSCOPIC
BILIRUBIN URINE: NEGATIVE
Glucose, UA: NEGATIVE mg/dL
KETONES UR: 20 mg/dL — AB
Nitrite: POSITIVE — AB
Protein, ur: 100 mg/dL — AB
Specific Gravity, Urine: 1.013 (ref 1.005–1.030)
pH: 5 (ref 5.0–8.0)

## 2017-11-30 LAB — COMPREHENSIVE METABOLIC PANEL
ALBUMIN: 3.7 g/dL (ref 3.5–5.0)
ALT: 15 U/L (ref 0–44)
ANION GAP: 8 (ref 5–15)
AST: 25 U/L (ref 15–41)
Alkaline Phosphatase: 75 U/L (ref 38–126)
BILIRUBIN TOTAL: 1.3 mg/dL — AB (ref 0.3–1.2)
BUN: 9 mg/dL (ref 6–20)
CALCIUM: 9.4 mg/dL (ref 8.9–10.3)
CHLORIDE: 104 mmol/L (ref 98–111)
CO2: 24 mmol/L (ref 22–32)
CREATININE: 0.9 mg/dL (ref 0.44–1.00)
GFR calc Af Amer: >60 mL/min (ref >60)
GFR calc non Af Amer: >60 mL/min (ref >60)
GLUCOSE: 94 mg/dL (ref 70–99)
POTASSIUM: 4.1 mmol/L (ref 3.5–5.1)
SODIUM: 136 mmol/L (ref 135–145)
Total Protein: 7.6 g/dL (ref 6.5–8.1)

## 2017-11-30 LAB — I-STAT BETA HCG BLOOD, ED (MC, WL, AP ONLY): I-stat hCG, quantitative: <5 m[IU]/mL (ref <5)

## 2017-11-30 MED ORDER — IOHEXOL 300 MG/ML  SOLN
100.0000 mL | Freq: Once | INTRAMUSCULAR | Status: AC | PRN
  Administered 2017-11-30: 100 mL via INTRAVENOUS

## 2017-11-30 MED ORDER — CEPHALEXIN 500 MG PO CAPS: 500.0000 mg | ORAL_CAPSULE | Freq: Four times a day (QID) | ORAL | 0 refills | Status: AC

## 2017-11-30 MED ORDER — SODIUM CHLORIDE 0.9 % IV BOLUS
1000.0000 mL | Freq: Once | INTRAVENOUS | Status: AC
  Administered 2017-11-30: 1000 mL via INTRAVENOUS

## 2017-11-30 MED ORDER — CEPHALEXIN 500 MG PO CAPS: 500.0000 mg | ORAL_CAPSULE | Freq: Four times a day (QID) | ORAL | 0 refills | Status: DC

## 2017-11-30 MED ORDER — ONDANSETRON 4 MG PO TBDP: 4.0000 mg | ORAL_TABLET | Freq: Three times a day (TID) | ORAL | 0 refills | Status: DC | PRN

## 2017-11-30 MED ORDER — SODIUM CHLORIDE 0.9 % IV SOLN
1.0000 g | Freq: Once | INTRAVENOUS | Status: AC
  Administered 2017-11-30: 1 g via INTRAVENOUS
  Filled 2017-11-30: qty 10

## 2017-11-30 NOTE — ED Provider Notes (Signed)
[facility] EMERGENCY DEPARTMENT Provider Note   CSN: [id number] Arrival date & time: 11/30/17  1447     History   Chief Complaint Chief Complaint  Patient presents with  . Emesis    HPI Meredith Kelly is a 22 y.o. female who presents to ED for 2-day history of left-sided lower abdominal pain rating to the left flank and left side of back, nausea, several episodes of nonbloody, nonbilious emesis.  She reports having a fever with T-max 104 today.  Last took an antipyretic approximately 2 hours prior to arrival.  She denies any dysuria but does have a history of pyelonephritis which presented similarly.  She had one episode of vomiting today.  Had some diarrhea yesterday.  Sick contacts include symptoms that ate the same food as her 3 days ago.  However, she states that "they feel better now."  She has not taken any medications to help with her symptoms.  Denies any prior abdominal surgeries, vaginal complaints, hematemesis, recent travel.  She denies any alcohol, tobacco or other drug use.  HPI  History reviewed. No pertinent past medical history.  Patient Active Problem List   Diagnosis Date Noted  . ANA positive 08/27/2017  . Dry eyes 08/27/2017  . Decreased circulation in fingers or toes 08/17/2017  . Allergic rhinitis due to animal hair and dander 08/17/2017    Past Surgical History:  Procedure Laterality Date  . WISDOM TOOTH EXTRACTION       OB History   None      Home Medications    Prior to Admission medications   Medication Sig Start Date End Date Taking? Authorizing Provider  cephALEXin (KEFLEX) 500 MG capsule Take 1 capsule (500 mg total) by mouth 4 (four) times daily for 10 days. 11/30/17 12/10/17  Jlon Betker, PA-C  Cranberry 425 MG CAPS Take 1 capsule by mouth daily.    [provider]  etonogestrel (NEXPLANON) 68 MG IMPL implant 1 each by Subdermal route once.    [provider]  ondansetron (ZOFRAN ODT) 4 MG  disintegrating tablet Take 1 tablet (4 mg total) by mouth every 8 (eight) hours as needed for nausea or vomiting. 11/30/17   Dietrich Pates, PA-C    Family History Family History  Problem Relation Age of Onset  . Lupus Maternal Grandmother   . Diabetes Maternal Grandmother   . Alzheimer's disease Paternal Grandfather     Social History Social History   Tobacco Use  . Smoking status: Never Smoker  . Smokeless tobacco: Never Used  Substance Use Topics  . Alcohol use: Never    Frequency: Never  . Drug use: Not Currently     Allergies   Patient has no known allergies.   Review of Systems Review of Systems  Constitutional: Positive for fever. Negative for appetite change and chills.  HENT: Negative for ear pain, rhinorrhea, sneezing and sore throat.   Eyes: Negative for photophobia and visual disturbance.  Respiratory: Negative for cough, chest tightness, shortness of breath and wheezing.   Cardiovascular: Negative for chest pain and palpitations.  Gastrointestinal: Positive for abdominal pain, diarrhea, nausea and vomiting. Negative for blood in stool and constipation.  Genitourinary: Positive for flank pain. Negative for dysuria, hematuria and urgency.  Musculoskeletal: Negative for myalgias.  Skin: Negative for rash.  Neurological: Negative for dizziness, weakness and light-headedness.     Physical Exam Updated Vital Signs BP 125/90   Pulse 97   Temp 98.5 F (36.9 C) (Oral)   Resp  16   Ht 5\' 8"  (1.727 m)   Wt 77.1 kg   SpO2 100%   BMI 25.85 kg/m   Physical Exam  Constitutional: She appears well-developed and well-nourished. No distress.  Nontoxic-appearing.  HENT:  Head: Normocephalic and atraumatic.  Nose: Nose normal.  Eyes: Conjunctivae and EOM are normal. Right eye exhibits no discharge. Left eye exhibits no discharge. No scleral icterus.  Neck: Normal range of motion. Neck supple.  Cardiovascular: Normal rate, regular rhythm, normal heart sounds and  intact distal pulses. Exam reveals no gallop and no friction rub.  No murmur heard. Pulmonary/Chest: Effort normal and breath sounds normal. No respiratory distress.  Abdominal: Soft. Bowel sounds are normal. She exhibits no distension. There is tenderness (Left lower quadrant, left CVA). There is no guarding.  Musculoskeletal: Normal range of motion. She exhibits no edema.  Neurological: She is alert. She exhibits normal muscle tone. Coordination normal.  Skin: Skin is warm and dry. No rash noted.  Psychiatric: She has a normal mood and affect.  Nursing note and vitals reviewed.    ED Treatments / Results  Labs (all labs ordered are listed, but only abnormal results are displayed) Labs Reviewed  COMPREHENSIVE METABOLIC PANEL - Abnormal; Notable for the following components:      Result Value   Total Bilirubin 1.3 (*)    All other components within normal limits  CBC WITH DIFFERENTIAL/PLATELET - Abnormal; Notable for the following components:   WBC 15.4 (*)    Neutro Abs 13.6 (*)    All other components within normal limits  URINALYSIS, ROUTINE W REFLEX MICROSCOPIC - Abnormal; Notable for the following components:   APPearance CLOUDY (*)    Hgb urine dipstick LARGE (*)    Ketones, ur 20 (*)    Protein, ur 100 (*)    Nitrite POSITIVE (*)    Leukocytes, UA LARGE (*)    WBC, UA >50 (*)    Bacteria, UA RARE (*)    All other components within normal limits  URINE CULTURE  I-STAT BETA HCG BLOOD, ED ([facility], [facility], [facility] ONLY)    EKG None  Radiology Ct Abdomen Pelvis W Contrast  Result Date: 11/30/2017 CLINICAL DATA:  Left-sided abdominal pain. History of renal infections. EXAM: CT ABDOMEN AND PELVIS WITH CONTRAST TECHNIQUE: Multidetector CT imaging of the abdomen and pelvis was performed using the standard protocol following bolus administration of intravenous contrast. CONTRAST:  100mL OMNIPAQUE IOHEXOL 300 MG/ML  SOLN COMPARISON:  None. FINDINGS: Lower chest: Minimal atelectasis at the  lung bases. Normal heart size without pericardial effusion. Hepatobiliary: No focal liver abnormality is seen. No gallstones, gallbladder wall thickening, or biliary dilatation. Pancreas: Unremarkable. No pancreatic ductal dilatation or surrounding inflammatory changes. Spleen: Normal in size without focal abnormality. Adrenals/Urinary Tract: In homogeneous enhancement of the left kidney with striated appearance compatible with changes of acute pyelonephritis. No definite evidence of lobar nephronia. No nephrolithiasis, enhancing renal mass nor hydroureteronephrosis. Mild mucosal enhancement of the urinary bladder some which could be due to underdistention but suspect a component of cystitis. Stomach/Bowel: Stomach is within normal limits. Appendix is not confidently identified but no pericecal inflammation to suggest acute appendicitis. No evidence of bowel wall thickening, distention, or inflammatory changes. Vascular/Lymphatic: No significant vascular findings are present. No enlarged abdominal or pelvic lymph nodes. Reproductive: Uterus and bilateral adnexa are unremarkable. Other: No abdominal wall hernia or abnormality. No abdominopelvic ascites. Musculoskeletal: No acute or significant osseous findings. IMPRESSION: 1. Striated appearance of the left kidney compatible with  acute pyelonephritis. No definite evidence of lobar nephronia. 2. Mild mucosal enhancement of the urinary bladder some which could be due to underdistention but suspect a component of cystitis. Electronically Signed   By: Tollie Eth M.D.   On: 11/30/2017 19:28    Procedures Procedures (including critical care time)  Medications Ordered in ED Medications  sodium chloride 0.9 % bolus 1,000 mL (0 mLs Intravenous Stopped 11/30/17 1720)  cefTRIAXone (ROCEPHIN) 1 g in sodium chloride 0.9 % 100 mL IVPB (0 g Intravenous Stopped 11/30/17 1838)  iohexol (OMNIPAQUE) 300 MG/ML solution 100 mL (100 mLs Intravenous Contrast Given 11/30/17 1858)      Initial Impression / Assessment and Plan / ED Course  I have reviewed the triage vital signs and the nursing notes.  Pertinent labs & imaging results that were available during my care of the patient were reviewed by me and considered in my medical decision making (see chart for details).  Clinical Course as of Nov 30 1933  Tue Nov 30, 2017  1714 WBC(!): 15.4 [HK]  1714 Nitrite(!): POSITIVE [HK]  1714 Leukocytes, UA(!): LARGE [HK]  1714 WBC, UA(!): >50 [HK]  1714 Bacteria, UA(!): RARE [HK]    Clinical Course User Index [HK] Dietrich Pates, PA-C    22 year old female presents to ED for 2-day history of left-sided lower abdominal pain rating the left flank, left side of back, nausea, several episodes of nonbloody, nonbilious emesis.  Fever with T-max 104 today.  Took antipyretic prior to arrival.  Sick contacts including friends but states that "they feel better now."  Has a history of pyelonephritis.  On exam there is tenderness palpation of the left lower quadrant, left CVA.  She is afebrile and appears overall well.  Plan is to obtain lab work, urinalysis and reassess.  Patient declines antiemetics or pain medication.  5:14 PM Lab work full record for leukocytosis of 14, urinalysis positive for nitrite, leukocytes, pyuria and bacteria.  I had a discussion with the patient regarding obtaining CT of the abdomen pelvis to evaluate for structural cause of her repeat pyelonephritis or any other underlying intra-abdominal abnormality.  Mother and patient are agreeable to obtaining CT.  Patient continuing to rest comfortably.  7:35 PM CT shows findings consistent with pyelonephritis.  Patient given 1 g of Rocephin IV here.  Will discharge home with 10-day course of Keflex and wait on urine cultures.  Continuing to rest comfortably, in NAD.  Will advise patient to return to ED for any severe worsening symptoms.  Patient is hemodynamically stable, in NAD, and able to ambulate in the ED.  Evaluation does not show pathology that would require ongoing emergent intervention or inpatient treatment. I explained the diagnosis to the patient. Pain has been managed and has no complaints prior to discharge. Patient is comfortable with above plan and is stable for discharge at this time. All questions were answered prior to disposition. Strict return precautions for returning to the ED were discussed. Encouraged follow up with PCP.    Portions of this note were generated with Scientist, clinical (histocompatibility and immunogenetics). Dictation errors may occur despite best attempts at proofreading.  Final Clinical Impressions(s) / ED Diagnoses   Final diagnoses:  Pyelonephritis    ED Discharge Orders         Ordered    cephALEXin (KEFLEX) 500 MG capsule  4 times daily,   Status:  Discontinued     11/30/17 1934    cephALEXin (KEFLEX) 500 MG capsule  4 times daily  11/30/17 1934    ondansetron (ZOFRAN ODT) 4 MG disintegrating tablet  Every 8 hours PRN     11/30/17 1935           Dietrich Pates, Cordelia Poche 11/30/17 Tye Savoy, MD 12/08/17 [phone]

## 2017-11-30 NOTE — ED Notes (Signed)
Got patient undress into a gown on the monitor patient is resting with nurse and family at bedside

## 2017-11-30 NOTE — ED Notes (Signed)
Patient transported to CT 

## 2017-11-30 NOTE — Discharge Instructions (Signed)
Take the antibiotics as prescribed.  Complete the entire course of these antibiotics regardless of symptom improvement to prevent worsening or recurrence of your infection. Return to ED for worsening symptoms, fever, increased pain with urination, severe abdominal pain.

## 2017-11-30 NOTE — ED Triage Notes (Signed)
Pt endorses fevers and N/V since Sunday. Pt reports taking tylenol around 120. Pt sent here from UC. Pt reports pain on left sided abdomen when UC provider pressed down, but none at rest. Pt reports hx of UTI, but denies any current urinary symptoms. Pt endorses  episode of vomiting n the past 24 hours.

## 2017-11-30 NOTE — ED Notes (Signed)
Patient verbalizes understanding of discharge instructions. Opportunity for questioning and answers were provided. Armband removed by staff, pt discharged from ED. Pt ambulatory to lobby with family.  

## 2017-12-02 ENCOUNTER — Other Ambulatory Visit: Payer: Self-pay

## 2017-12-02 DIAGNOSIS — R509 Fever, unspecified: Secondary | ICD-10-CM

## 2017-12-02 NOTE — Telephone Encounter (Signed)
Please reply to patient Meredith AuWe're glad she went and got checked out It sounds like we need a specialist to get involved (Infectious disease) Continue to alternate over-the-counter antipyretics (tylenol and motrin) per package instructions REFER to infectious disease here ASAP: recurrent fevers

## 2017-12-03 LAB — URINE CULTURE: Culture: >=100000 — AB

## 2017-12-04 ENCOUNTER — Telehealth: Payer: Self-pay

## 2017-12-04 NOTE — Telephone Encounter (Signed)
Post ED Visit - Positive Culture Follow-up  Culture report reviewed by antimicrobial stewardship pharmacist:  []  Meredith Kelly, Pharm.D. []  Meredith Kelly, Pharm.D., BCPS AQ-ID [x]  Meredith Kelly, Pharm.D., BCPS []  Meredith Kelly, Pharm.D., BCPS []  Meredith Kelly, [address]Pharm[address]D., BCPS, AAHIVP []  Meredith Kelly, Pharm.D., BCPS, AAHIVP []  Meredith Kelly, PharmD, BCPS []  Meredith Kelly, PharmD, BCPS []  Meredith Kelly, PharmD, BCPS []  Meredith Kelly, PharmD  Positive urine culture Treated with Cephalexin, organism sensitive to the same and no further patient follow-up is required at this time.  Meredith Kelly, Meredith Kelly 12/04/2017, 10:25 AM

## 2017-12-09 ENCOUNTER — Ambulatory Visit: Payer: Self-pay | Attending: Infectious Diseases | Admitting: Infectious Diseases

## 2017-12-09 ENCOUNTER — Other Ambulatory Visit (HOSPITAL_COMMUNITY)
Admission: RE | Admit: 2017-12-09 | Discharge: 2017-12-09 | Disposition: A | Discharge status: Home / Self Care | Payer: Self-pay | Source: Ambulatory Visit | Attending: Obstetrics and Gynecology | Admitting: Obstetrics and Gynecology

## 2017-12-09 ENCOUNTER — Encounter: Payer: Self-pay | Admitting: Obstetrics and Gynecology

## 2017-12-09 ENCOUNTER — Ambulatory Visit (INDEPENDENT_AMBULATORY_CARE_PROVIDER_SITE_OTHER): Payer: No Typology Code available for payment source | Admitting: Obstetrics and Gynecology

## 2017-12-09 ENCOUNTER — Other Ambulatory Visit: Payer: Self-pay | Admitting: Family Medicine

## 2017-12-09 VITALS — BP 137/96 | HR 98 | Temp 97.4°F | Wt 171.1 lb

## 2017-12-09 VITALS — BP 120/80 | Ht 68.0 in | Wt 171.0 lb

## 2017-12-09 DIAGNOSIS — Z113 Encounter for screening for infections with a predominantly sexual mode of transmission: Secondary | ICD-10-CM

## 2017-12-09 DIAGNOSIS — J029 Acute pharyngitis, unspecified: Secondary | ICD-10-CM

## 2017-12-09 DIAGNOSIS — R5383 Other fatigue: Secondary | ICD-10-CM

## 2017-12-09 DIAGNOSIS — N12 Tubulo-interstitial nephritis, not specified as acute or chronic: Secondary | ICD-10-CM

## 2017-12-09 DIAGNOSIS — Z3009 Encounter for other general counseling and advice on contraception: Secondary | ICD-10-CM

## 2017-12-09 DIAGNOSIS — R509 Fever, unspecified: Secondary | ICD-10-CM

## 2017-12-09 DIAGNOSIS — Z975 Presence of (intrauterine) contraceptive device: Secondary | ICD-10-CM

## 2017-12-09 DIAGNOSIS — N390 Urinary tract infection, site not specified: Secondary | ICD-10-CM

## 2017-12-09 DIAGNOSIS — R768 Other specified abnormal immunological findings in serum: Secondary | ICD-10-CM

## 2017-12-09 DIAGNOSIS — Z87440 Personal history of urinary (tract) infections: Secondary | ICD-10-CM

## 2017-12-09 NOTE — Progress Notes (Signed)
NAME: Meredith Kelly  DOB: 30-Dec-1995  MRN: [id number]  Date/Time: 12/09/2017 1:27 PM Subjective:  REASON FOR CONSULT: fever ?pt is here with her  mother  Meredith Kelly is a 22 y.o.female is here to discuss about fever Pt is a Holiday representativesenior at [employer] and also works part time as an Airline pilotaccountant in a company. For the past year she has been fatigued and low grade fever ( 99.6) not more than 100,. She has had frequent colds in the past year. She has never missed school. Mother says as a child she used to get sick often and later she did not allow her outside a lot and she was fine for 10 years with no need for any antibiotics. She established care with PCP Dr.Lada in July 2019 and during that visit had pharyngitis and strep A was negative. She checked  ANA and it was positive at 1:160 ( homogenous and nucleolar) . She was referred to rheumatologist dr.Bak- the rest of the panel was negative except anticardiolipin antibody IgM = 14 Pt is being observed Meanwhile on aug 22nd she developed sudden onset of high fever and tachycardia and went to her PCP and was sent ot ED. Temp of 103, WBC of 21 She never had urinary symptoms but UA showed 50 WBC.  She received a dose of ceftriaxone and was discharged home on keflex. In sept when she saw the rheumatologist she checked her urine and it positive for e.coli ( pan sensitive) and gave her bactrim for a few days. Pt presented to the ED on 11/30/17 with sudden onset of high fever of 104 and no other symptoms but on examination was noted to have flank tenderness. CT abdomen revealed left pyelonephritis and WBC was 15.4. Blood and urine culture sent and she was given 1 gram of ceftriaxone and sent home on keflex for 10 days. Culture was positive for pan sensitive e.coli. She has no more fever but has some left flank discomfort when on moving No dysuria, hematuria New boyfriend since July 2019, sexually active Has nexplanon for birth control No STDs in the past Today will  have her first ob visit occasionally has periods and uses tampons   No past medical history on file.  Past Surgical History:  Procedure Laterality Date  . WISDOM TOOTH EXTRACTION      SH Non smoker No alcohol   Family History  Problem Relation Age of Onset  . Lupus Maternal Grandmother   . Diabetes Maternal Grandmother   . Alzheimer's disease Paternal Grandfather    No Known Allergies ? Current Outpatient Medications  Medication Sig Dispense Refill  . cephALEXin (KEFLEX) 500 MG capsule Take 1 capsule (500 mg total) by mouth 4 (four) times daily for 10 days. 40 capsule 0  . etonogestrel (NEXPLANON) 68 MG IMPL implant 1 each by Subdermal route once.    . ondansetron (ZOFRAN ODT) 4 MG disintegrating tablet Take 1 tablet (4 mg total) by mouth every 8 (eight) hours as needed for nausea or vomiting. 6 tablet 0  . Cranberry 425 MG CAPS Take 1 capsule by mouth daily.     No current facility-administered medications for this visit.      Abtx:  Anti-infectives (From admission, onward)   None      REVIEW OF SYSTEMS:  Const: negative fever, negative chills, negative weight loss Eyes: negative diplopia or visual changes, negative eye pain ENT: negative coryza, negative sore throat Resp: negative cough, hemoptysis, dyspnea Cards: negative for chest pain, palpitations, lower extremity  edema GU: negative for frequency, dysuria and hematuria GI: Negative for abdominal pain, diarrhea, bleeding, constipation Skin: negative for rash and pruritus Heme: negative for easy bruising and gum/nose bleeding MS:  myalgias, arthralgias, back pain Neurolo:negative for headaches, dizziness, vertigo, memory problems  Psych: negative for feelings of anxiety, depression  Endocrine: negative for thyroid, diabetes Allergy/Immunology- negative for any medication or food allergies ? Pertinent Positives include : Objective:  VITALS:  BP (!) 137/96 (BP Location: Left Arm, Patient Position: Sitting,  Cuff Size: Normal)   Pulse 98   Temp (!) 97.4 F (36.3 C) (Oral)   Wt 171 lb 2 oz (77.6 kg)   BMI 26.02 kg/m  PHYSICAL EXAM:  General: Alert, cooperative, no distress, appears stated age.  Head: Normocephalic, without obvious abnormality, atraumatic. Eyes: Conjunctivae clear, anicteric sclerae. Pupils are equal ENT Nares normal. No drainage or sinus tenderness. Lips, mucosa, and tongue normal. No Thrush, fleshy tonsils Neck: Supple, symmetrical, small  1 cm cervical lymphnodes, thyroid: non tender no carotid bruit and no JVD. Back: No CVA tenderness. Lungs: Clear to auscultation bilaterally. No Wheezing or Rhonchi. No rales. Heart: Regular rate and rhythm, no murmur, rub or gallop. Abdomen: Soft, non-tender,not distended. Bowel sounds normal. No masses Extremities: atraumatic, no cyanosis. No edema. No clubbing Skin: No rashes or lesions. Or bruising Lymph: Cervical, supraclavicular normal. Neurologic: Grossly non-focal Pertinent Labs Lab Results CBC    Component Value Date/Time   WBC 15.4 (H) 11/30/2017 1606   RBC 4.87 11/30/2017 1606   HGB 13.4 11/30/2017 1606   HCT 42.7 11/30/2017 1606   PLT 227 11/30/2017 1606   MCV 87.7 11/30/2017 1606   MCH 27.5 11/30/2017 1606   MCHC 31.4 11/30/2017 1606   RDW 13.9 11/30/2017 1606   LYMPHSABS 0.7 11/30/2017 1606   MONOABS 1.0 11/30/2017 1606   EOSABS 0.0 11/30/2017 1606   BASOSABS 0.0 11/30/2017 1606    CMP Latest Ref Rng & Units 11/30/2017 09/09/2017 08/17/2017  Glucose 70 - 99 mg/dL 94 161(W) 97  BUN 6 - 20 mg/dL 9 13 8   Creatinine 0.44 - 1.00 mg/dL 9.60 4.54 0.98  Sodium 135 - 145 mmol/L 136 136 139  Potassium 3.5 - 5.1 mmol/L 4.1 3.5 4.0  Chloride 98 - 111 mmol/L 104 103 103  CO2 22 - 32 mmol/L 24 24 30   Calcium 8.9 - 10.3 mg/dL 9.4 9.7 11.9  Total Protein 6.5 - 8.1 g/dL 7.6 1.4(N) 7.1  Total Bilirubin 0.3 - 1.2 mg/dL 8.2(N) 1.0 0.4  Alkaline Phos 38 - 126 U/L 75 67 -  AST 15 - 41 U/L 25 24 13   ALT 0 - 44 U/L 15 24 14        Microbiology: Recent Results (from the past 240 hour(s))  Urine culture     Status: Abnormal   Collection Time: 11/30/17  4:06 PM  Result Value Ref Range Status   Specimen Description URINE, CLEAN CATCH  Final   Special Requests   Final    NONE Performed at [facility], [address], [place], [place] [address]    Culture >=100,000 COLONIES/mL ESCHERICHIA COLI (A)  Final   Report Status 12/03/2017 FINAL  Final   Organism ID, Bacteria ESCHERICHIA COLI (A)  Final      Susceptibility   Escherichia coli - MIC*    AMPICILLIN 8 SENSITIVE Sensitive     CEFAZOLIN <=4 SENSITIVE Sensitive     CEFTRIAXONE <=1 SENSITIVE Sensitive     CIPROFLOXACIN <=0.25 SENSITIVE Sensitive  GENTAMICIN <=1 SENSITIVE Sensitive     IMIPENEM <=0.25 SENSITIVE Sensitive     NITROFURANTOIN <=16 SENSITIVE Sensitive     TRIMETH/SULFA <=20 SENSITIVE Sensitive     AMPICILLIN/SULBACTAM 4 SENSITIVE Sensitive     PIP/TAZO <=4 SENSITIVE Sensitive     Extended ESBL NEGATIVE Sensitive     * >=100,000 COLONIES/mL ESCHERICHIA COLI   IMAGING RESULTS: ? Impression/Recommendation 22 y.o.female is here to discuss about fever? ? She has 2 issues 1) Recurrent UTI -sudden onset high grade fever with no urinary symptoms  X 2 one in aug and one in November and e.coli in the urine from last time- pan sensitive. In Nov had Ct evidence of pyelonephritis and tender left flank on palpation though never had any pain. Unusual presentation of urosepsis/pyelonephritis Mother is worried because of lack of warning symptoms and presence of high grade fever ?Need to r/o vesicoureteric reflux.  urology referral is warranted. Other thing to consider is whether  the fever has  another cause and the urine findings is coincidental.. Normally would not check urine to look for clearance of bacteria and also normally would not do suppressive antibiotic therapy because of risk of MDRO. But because of high grade fever happening all  of a sudden may consider bactrim suppressive therapy.   2) fatigue and low grade fever and recurrent sore throat for a year-  ANA positive 1:160 and Anticardiolipin antibody positive at 14. Followed by rheumatology. Is this a true positive ANA or fase positive EBV infection in the past could give positive ANA but there is no point in checking EBV now as even if it is positive we will not do anything about it. It may be worth repeating  ANA at a later date. HIV neg  _Advised patient of the following Hygiene after sex Drinking plenty of water Urinating frequently Follow up with urologist to r/o vesicoureteric reflex. ?HPV vaccine  __________________________________________________ Discussed with patient and mother

## 2017-12-09 NOTE — Patient Instructions (Addendum)
You have been referred to me for fever You have had fatigue for a year ( temp of 99) and also frequent colds. You saw Rheumatology and had a positive ANA Since August you have had 2 episodes with high fever happening all of a sudden and diagnosed with UTI. frst one was in Aug and 2nd was in Nov 12. The latter you had a temp of 104 within an hour and you never had urinary symptoms. In the ED when the doctor examined your abdomen you had pain on palpating the left side- CT scan showed left pyelonephritis.  The urine culture has e.coli and you are now taking keflex.  you are concerned that you developed such high fever without any warning symptoms. You can take the following precautions Hygiene after sex Drinking plenty f water Urinating frequently Follow up with urologist to r/o vesicoureteric reflex. Normally will not check urine to look for clearance of bacteria but if the flank pain persist may do so May consider suppressive antibiotic therapy  I will discuss with your PCP

## 2017-12-09 NOTE — Progress Notes (Signed)
Patient ID: Meredith Kelly, female   DOB: [birth year], 22 y.o.   MRN: [id number]  Reason for Consult: Referral (Wants to keep nexplanon until January, talk about [facility])   Referred by Cheryle Horsfall, NP  Subjective:     HPI:  Meredith Kelly is a 22 y.o. female. She would like to discuss birth control options  History reviewed. No pertinent past medical history. Family History  Problem Relation Age of Onset  . Lupus Maternal Grandmother   . Diabetes Maternal Grandmother   . Alzheimer's disease Paternal Grandfather    Past Surgical History:  Procedure Laterality Date  . WISDOM TOOTH EXTRACTION      Short Social History:  Social History   Tobacco Use  . Smoking status: Never Smoker  . Smokeless tobacco: Never Used  Substance Use Topics  . Alcohol use: Never    Frequency: Never    No Known Allergies  Current Outpatient Medications  Medication Sig Dispense Refill  . cephALEXin (KEFLEX) 500 MG capsule Take 1 capsule (500 mg total) by mouth 4 (four) times daily for 10 days. 40 capsule 0  . Cranberry 425 MG CAPS Take 1 capsule by mouth daily.    Marland Kitchen etonogestrel (NEXPLANON) 68 MG IMPL implant 1 each by Subdermal route once.    . ondansetron (ZOFRAN ODT) 4 MG disintegrating tablet Take 1 tablet (4 mg total) by mouth every 8 (eight) hours as needed for nausea or vomiting. (Patient not taking: Reported on 12/09/2017) 6 tablet 0   No current facility-administered medications for this visit.     Review of Systems  Constitutional: Negative for chills, fatigue, fever and unexpected weight change.  HENT: Negative for trouble swallowing.  Eyes: Negative for loss of vision.  Respiratory: Negative for cough, shortness of breath and wheezing.  Cardiovascular: Negative for chest pain, leg swelling, palpitations and syncope.  GI: Negative for abdominal pain, blood in stool, diarrhea, nausea and vomiting.  GU: Negative for difficulty urinating, dysuria, frequency and hematuria.   Musculoskeletal: Negative for back pain, leg pain and joint pain.  Skin: Negative for rash.  Neurological: Negative for dizziness, headaches, light-headedness, numbness and seizures.  Psychiatric: Negative for behavioral problem, confusion, depressed mood and sleep disturbance.        Objective:  Objective   Vitals:   12/09/17 1422  BP: 120/80  Weight: 171 lb (77.6 kg)  Height: 5\' 8"  (1.727 m)   Body mass index is 26 kg/m.  Physical Exam  Constitutional: She is oriented to person, place, and time. She appears well-developed and well-nourished.  HENT:  Head: Normocephalic and atraumatic.  Eyes: Pupils are equal, round, and reactive to light. EOM are normal.  Cardiovascular: Normal rate and regular rhythm.  Pulmonary/Chest: Effort normal. No respiratory distress.  Neurological: She is alert and oriented to person, place, and time.  Skin: Skin is warm and dry.  Psychiatric: She has a normal mood and affect. Her behavior is normal. Judgment and thought content normal.  Nursing note and vitals reviewed.      Assessment/Plan:     22 yo here for contraception counseling.  Reviewed options for contraception.   More than 30 minutes were spent face to face with the patient in the room with more than 50% of the time spent providing counseling and discussing the plan of management. We discussed nexplanon insertion, screening for STDs, HPV vaccination and pap smears.   Will return in about 1 month for removal and replacement of nexplanon. STD screening today. She will  consider HPV vaccination  Adelene Idlerhristanna Schuman MD [facility], [facility] 12/09/17 3:13 PM

## 2017-12-09 NOTE — Progress Notes (Signed)
Refer to urology Appreciate note from infectious disease specialist, Dr. Rivka Saferavishankar

## 2017-12-09 NOTE — Patient Instructions (Signed)
HPV (Human Papillomavirus) Vaccine: What You Need to Know  1. Why get vaccinated?  HPV vaccine prevents infection with human papillomavirus (HPV) types that are associated with many cancers, including:  · cervical cancer in females,  · vaginal and vulvar cancers in females,  · anal cancer in females and males,  · throat cancer in females and males, and  · penile cancer in males.    In addition, HPV vaccine prevents infection with HPV types that cause genital warts in both females and males.  In the [place] about 12,000 women get cervical cancer every year, and about 4,000 women die from it. HPV vaccine can prevent most of these cases of cervical cancer.  Vaccination is not a substitute for cervical cancer screening. This vaccine does not protect against all HPV types that can cause cervical cancer. Women should still get regular Pap tests.  HPV infection usually comes from sexual contact, and most people will become infected at some point in their life. About 14 million Americans, including teens, get infected every year. Most infections will go away on their own and not cause serious problems. But thousands of women and men get cancer and other diseases from HPV.  2. HPV vaccine  HPV vaccine is approved by FDA and is recommended by CDC for both males and females. It is routinely given at 11 or 20 years of age, but it may be given beginning at age 9 years through age 26 years.  Most adolescents 9 through 20 years of age should get HPV vaccine as a two-dose series with the doses separated by 6-12 months. People who start HPV vaccination at 15 years of age and older should get the vaccine as a three-dose series with the second dose given 1-2 months after the first dose and the third dose given 6 months after the first dose. There are several exceptions to these age recommendations. Your health care provider can give you more information.  3. Some people should not get this vaccine   · Anyone who has had a severe (life-threatening) allergic reaction to a dose of HPV vaccine should not get another dose.  · Anyone who has a severe (life threatening) allergy to any component of HPV vaccine should not get the vaccine.  · Tell your doctor if you have any severe allergies that you know of, including a severe allergy to yeast.  · HPV vaccine is not recommended for pregnant women. If you learn that you were pregnant when you were vaccinated, there is no reason to expect any problems for you or your baby. Any woman who learns she was pregnant when she got HPV vaccine is encouraged to contact the manufacturer's registry for HPV vaccination during pregnancy at 1-800-986-8999. Women who are breastfeeding may be vaccinated.  · If you have a mild illness, such as a cold, you can probably get the vaccine today. If you are moderately or severely ill, you should probably wait until you recover. Your doctor can advise you.  4. Risks of a vaccine reaction  With any medicine, including vaccines, there is a chance of side effects. These are usually mild and go away on their own, but serious reactions are also possible.  Most people who get HPV vaccine do not have any serious problems with it.  Mild or moderate problems following HPV vaccine:  · Reactions in the arm where the shot was given:  ? Soreness (about 9 people in 10)  ? Redness or swelling (about 1 person   in 3)  · Fever:  ? Mild (100°F) (about 1 person in 10)  ? Moderate (102°F) (about 1 person in 65)  · Other problems:  ? Headache (about 1 person in 3)  Problems that could happen after any injected vaccine:  · People sometimes faint after a medical procedure, including vaccination. Sitting or lying down for about 15 minutes can help prevent fainting, and injuries caused by a fall. Tell your doctor if you feel dizzy, or have vision changes or ringing in the ears.  · Some people get severe pain in the shoulder and have difficulty moving  the arm where a shot was given. This happens very rarely.  · Any medication can cause a severe allergic reaction. Such reactions from a vaccine are very rare, estimated at about 1 in a million doses, and would happen within a few minutes to a few hours after the vaccination.  As with any medicine, there is a very remote chance of a vaccine causing a serious injury or death.  The safety of vaccines is always being monitored. For more information, visit: www.cdc.gov/vaccinesafety/.  5. What if there is a serious reaction?  What should I look for?  Look for anything that concerns you, such as signs of a severe allergic reaction, very high fever, or unusual behavior.  Signs of a severe allergic reaction can include hives, swelling of the face and throat, difficulty breathing, a fast heartbeat, dizziness, and weakness. These would usually start a few minutes to a few hours after the vaccination.  What should I do?  If you think it is a severe allergic reaction or other emergency that can't wait, call 9-1-1 or get to the nearest hospital. Otherwise, call your doctor.  Afterward, the reaction should be reported to the Vaccine Adverse Event Reporting System (VAERS). Your doctor should file this report, or you can do it yourself through the VAERS web site at www.vaers.hhs.gov, or by calling 1-800-822-7967.  VAERS does not give medical advice.  6. The National Vaccine Injury Compensation Program  The National Vaccine Injury Compensation Program (VICP) is a federal program that was created to compensate people who may have been injured by certain vaccines.  Persons who believe they may have been injured by a vaccine can learn about the program and about filing a claim by calling 1-800-338-2382 or visiting the VICP website at www.hrsa.gov/vaccinecompensation. There is a time limit to file a claim for compensation.  7. How can I learn more?  · Ask your health care provider. He or she can give you the vaccine  package insert or suggest other sources of information.  · Call your local or state health department.  · Contact the Centers for Disease Control and Prevention (CDC):  ? Call 1-800-232-4636 (1-800-CDC-INFO) or  ? Visit CDC’s website at www.cdc.gov/hpv  Vaccine Information Statement, HPV Vaccine (12/21/2014)  This information is not intended to replace advice given to you by your health care provider. Make sure you discuss any questions you have with your health care provider.  Document Released: 08/02/2013 Document Revised: 09/26/2015 Document Reviewed: 09/26/2015  Elsevier Interactive Patient Education © 2017 Elsevier Inc.

## 2017-12-10 ENCOUNTER — Telehealth: Payer: Self-pay | Admitting: Urology

## 2017-12-10 NOTE — Telephone Encounter (Signed)
Would you please schedule an appointment for this patient with any provider next week?  She was seen by Dr. Rivka Saferavishankar (ID) and she is concerned that Maicy may have vesicoureteral reflux.

## 2017-12-10 NOTE — Telephone Encounter (Signed)
-----   Message from Kerman PasseyMelinda P Lada, MD sent at 12/09/2017  6:03 PM EST ----- Regarding: Please see note from infectious disease Duwayne HeckGreetings, Daijha Leggio and Morrie Sheldonshley! I hope you are both well. Can one of you see this nice young lady? Please see the infectious disease note from Dr. Rivka Saferavishankar from 12/09/17. Thank you!  ----- Message ----- From: Lynn Itoavishankar, Jayashree, MD Sent: 12/09/2017   3:10 PM EST To: Kerman PasseyMelinda P Lada, MD

## 2017-12-10 NOTE — Telephone Encounter (Addendum)
we don't have any new patient apps until Dec/Jan? I will try to work her in but we all totally booked.    Marcelino DusterMichelle

## 2017-12-13 ENCOUNTER — Ambulatory Visit (INDEPENDENT_AMBULATORY_CARE_PROVIDER_SITE_OTHER): Payer: PRIVATE HEALTH INSURANCE | Admitting: Urology

## 2017-12-13 ENCOUNTER — Encounter: Payer: Self-pay | Admitting: Urology

## 2017-12-13 VITALS — BP 143/88 | HR 94 | Ht 68.0 in | Wt 171.9 lb

## 2017-12-13 DIAGNOSIS — N390 Urinary tract infection, site not specified: Secondary | ICD-10-CM

## 2017-12-13 LAB — MICROSCOPIC EXAMINATION
RBC, UA: NONE SEEN /hpf (ref 0–2)
WBC, UA: NONE SEEN /hpf (ref 0–5)

## 2017-12-13 LAB — URINALYSIS, COMPLETE
Bilirubin, UA: NEGATIVE
Glucose, UA: NEGATIVE
Ketones, UA: NEGATIVE
LEUKOCYTES UA: NEGATIVE
Nitrite, UA: NEGATIVE
PH UA: 5.5 (ref 5.0–7.5)
PROTEIN UA: NEGATIVE
RBC, UA: NEGATIVE
Specific Gravity, UA: 1.02 (ref 1.005–1.030)
Urobilinogen, Ur: 0.2 mg/dL (ref 0.2–1.0)

## 2017-12-13 LAB — CERVICOVAGINAL ANCILLARY ONLY
CHLAMYDIA, DNA PROBE: NEGATIVE
Neisseria Gonorrhea: NEGATIVE

## 2017-12-13 LAB — BLADDER SCAN AMB NON-IMAGING: Scan Result: 182

## 2017-12-13 MED ORDER — TRIMETHOPRIM 100 MG PO TABS: 100.0000 mg | ORAL_TABLET | Freq: Every day | ORAL | 11 refills | Status: DC

## 2017-12-13 NOTE — Progress Notes (Signed)
Negative, Released to mychart 

## 2017-12-13 NOTE — Progress Notes (Signed)
12/13/2017 1:59 PM   Meredith Kelly 1995-05-27 [id number]  Referring provider: Kerman Passey, MD [address] [place], [place] [address]  Chief Complaint  Patient presents with  . New Patient (Initial Visit)     Pyelonephritis    HPI: I was consulted to assess the patient for recurrent urinary tract infections.  She then she has had 2 kidney infections with a high fever that came on suddenly in the last few months.  She was not having a lot of other symptoms otherwise.  It took about 3 days for the fever that was significant to normalize.  She describes an elevated white blood count.  The patient normally voids every 2 or 3 hours and gets up once or twice a night.  She normally does not get bladder infections.  The patient had a CT scan in November 2019 with left renal changes in keeping with pyelonephritis.  She had a positive culture.  A few months earlier she actually had a positive blood culture with a different organism but I did not see a urine culture from that encounter  She denies a history of kidney stones and previous to surgery.  She does not have childhood infections  Modifying factors: There are no other modifying factors  Associated signs and symptoms: There are no other associated signs and symptoms Aggravating and relieving factors: There are no other aggravating or relieving factors Severity: Moderate Duration: Persistent   PMH: History reviewed. No pertinent past medical history.  Surgical History: Past Surgical History:  Procedure Laterality Date  . WISDOM TOOTH EXTRACTION      Home Medications:  Allergies as of 12/13/2017   No Known Allergies     Medication List        Accurate as of 12/13/17  1:59 PM. Always use your most recent med list.          Cranberry 425 MG Caps Take 1 capsule by mouth daily.   etonogestrel 68 MG Impl implant Commonly known as:  NEXPLANON 1 each by Subdermal route once.   ondansetron 4 MG  disintegrating tablet Commonly known as:  ZOFRAN-ODT Take 1 tablet (4 mg total) by mouth every 8 (eight) hours as needed for nausea or vomiting.       Allergies: No Known Allergies  Family History: Family History  Problem Relation Age of Onset  . Lupus Maternal Grandmother   . Diabetes Maternal Grandmother   . Alzheimer's disease Paternal Grandfather     Social History:  reports that she has never smoked. She has never used smokeless tobacco. She reports that she has current or past drug history. She reports that she does not drink alcohol.  ROS: UROLOGY Frequent Urination?: No Hard to postpone urination?: No Burning/pain with urination?: No Get up at night to urinate?: Yes Leakage of urine?: No Urine stream starts and stops?: No Trouble starting stream?: No Do you have to strain to urinate?: No Blood in urine?: No Urinary tract infection?: Yes Sexually transmitted disease?: No Injury to kidneys or bladder?: No Painful intercourse?: No Weak stream?: No Currently pregnant?: No Vaginal bleeding?: Yes Last menstrual period?: n  Gastrointestinal Nausea?: No Vomiting?: No Indigestion/heartburn?: No Diarrhea?: No Constipation?: No  Constitutional Fever: Yes Night sweats?: No Weight loss?: No Fatigue?: Yes  Skin Skin rash/lesions?: No Itching?: No  Eyes Blurred vision?: No Double vision?: No  Ears/Nose/Throat Sore throat?: Yes Sinus problems?: Yes  Hematologic/Lymphatic Swollen glands?: Yes Easy bruising?: No  Cardiovascular Leg swelling?: No Chest  pain?: No  Respiratory Cough?: No Shortness of breath?: No  Endocrine Excessive thirst?: No  Musculoskeletal Back pain?: Yes Joint pain?: Yes  Neurological Headaches?: Yes Dizziness?: No  Psychologic Depression?: No Anxiety?: No  Physical Exam: BP (!) 143/88   Pulse 94   Ht 5\' 8"  (1.727 m)   Wt 171 lb 14.4 oz (78 kg)   LMP  (LMP Unknown)   SpO2 98%   BMI 26.14 kg/m     Constitutional:  Alert and oriented, No acute distress. HEENT: [place] AT, moist mucus membranes.  Trachea midline, no masses. Cardiovascular: No clubbing, cyanosis, or edema. Respiratory: Normal respiratory effort, no increased work of breathing. GI: Abdomen is soft, nontender, nondistended, no abdominal masses GU: No CVA tenderness.  No bladder tenderness Skin: No rashes, bruises or suspicious lesions. Lymph: No cervical or inguinal adenopathy. Neurologic: Grossly intact, no focal deficits, moving all 4 extremities. Psychiatric: Normal mood and affect.  Laboratory Data: Lab Results  Component Value Date   WBC 15.4 (H) 11/30/2017   HGB 13.4 11/30/2017   HCT 42.7 11/30/2017   MCV 87.7 11/30/2017   PLT 227 11/30/2017    Lab Results  Component Value Date   CREATININE 0.90 11/30/2017    No results found for: PSA  No results found for: TESTOSTERONE  No results found for: HGBA1C  Urinalysis    Component Value Date/Time   COLORURINE YELLOW 11/30/2017 1606   APPEARANCEUR CLOUDY (A) 11/30/2017 1606   LABSPEC 1.013 11/30/2017 1606   PHURINE 5.0 11/30/2017 1606   GLUCOSEU NEGATIVE 11/30/2017 1606   HGBUR LARGE (A) 11/30/2017 1606   BILIRUBINUR NEGATIVE 11/30/2017 1606   BILIRUBINUR Negative 11/16/2017 1550   KETONESUR 20 (A) 11/30/2017 1606   PROTEINUR 100 (A) 11/30/2017 1606   UROBILINOGEN 0.2 11/16/2017 1550   NITRITE POSITIVE (A) 11/30/2017 1606   LEUKOCYTESUR LARGE (A) 11/30/2017 1606    Pertinent Imaging: As noted  Assessment & Plan: Patient and I spoke about the pathophysiology of recurrent bladder infections.  Her residual today was 182 mL but it could represent a false positive based upon today's voiding pattern.  Reassess in 6 or 7 weeks on daily trimethoprim.  Probiotics discussed.  In the last 2 years she sometimes get low-grade fever and I think is not related.  1. Recurrent UTI  - Urinalysis, Complete   No follow-ups on file.  Martina SinnerMACDIARMID,Mry Lamia A,  MD  [facility] [address], [address] [place], [place] [address] [phone]

## 2017-12-13 NOTE — Addendum Note (Signed)
Addended by: Sinda DuKENNEDY, CARYL-LYN M on: 12/13/2017 02:26 PM   Modules accepted: Orders

## 2017-12-15 LAB — CULTURE, URINE COMPREHENSIVE

## 2017-12-21 ENCOUNTER — Encounter: Payer: Self-pay | Admitting: Family Medicine

## 2017-12-22 ENCOUNTER — Encounter: Payer: Self-pay | Admitting: Family Medicine

## 2017-12-30 ENCOUNTER — Telehealth: Payer: Self-pay | Admitting: Obstetrics and Gynecology

## 2017-12-30 NOTE — Telephone Encounter (Signed)
Patient is schedule 1/9/19at 2:30 for an nexplanon removal and reinsertion with Dr. Jerene PitchSchuman

## 2017-12-31 NOTE — Telephone Encounter (Signed)
Noted. Will order to arrive by apt date/time. 

## 2018-01-06 ENCOUNTER — Ambulatory Visit: Payer: No Typology Code available for payment source | Admitting: Obstetrics and Gynecology

## 2018-01-24 NOTE — Telephone Encounter (Signed)
Nexplanon reserved for this patients appointment 01/27/2018

## 2018-01-27 ENCOUNTER — Encounter: Payer: Self-pay | Admitting: Obstetrics and Gynecology

## 2018-01-27 ENCOUNTER — Ambulatory Visit (INDEPENDENT_AMBULATORY_CARE_PROVIDER_SITE_OTHER): Payer: BLUE CROSS/BLUE SHIELD | Admitting: Obstetrics and Gynecology

## 2018-01-27 VITALS — BP 136/82 | Ht 68.0 in | Wt 178.0 lb

## 2018-01-27 DIAGNOSIS — Z3049 Encounter for surveillance of other contraceptives: Secondary | ICD-10-CM

## 2018-01-27 DIAGNOSIS — Z30017 Encounter for initial prescription of implantable subdermal contraceptive: Secondary | ICD-10-CM

## 2018-01-27 DIAGNOSIS — Z3046 Encounter for surveillance of implantable subdermal contraceptive: Secondary | ICD-10-CM

## 2018-01-27 MED ORDER — ETONOGESTREL 68 MG ~~LOC~~ IMPL: 68.0000 mg | DRUG_IMPLANT | Freq: Once | SUBCUTANEOUS | Status: AC

## 2018-01-27 NOTE — Progress Notes (Signed)
    Nexplanon removal  Procedure note - The Nexplanon was noted in the patient's arm and the end was identified. The skin was cleansed with a Betadine solution. A small injection of subcutaneous lidocaine with epinephrine was given over the end of the implant. An incision was made at the end of the implant. The rod was noted in the incision and grasped with a hemostat. It was noted to be intact.  Steri-Strip was placed approximating the incision. Hemostasis was noted.  Nexplanon Insertion  Patient given informed consent, signed copy in the chart, time out was performed. Appropriate time out taken.  Patient's left arm was prepped and draped in the usual sterile fashion.. The ruler used to measure and mark insertion area.  Pt was prepped with betadine swab and then injected with 5 cc of 2% lidocaine with epinephrine. Nexplanon removed form packaging,  Device confirmed in needle, then inserted full length of needle and withdrawn per handbook instructions.  Pt insertion site covered with steri-strip and a bandage.   Minimal blood loss.  Pt tolerated the procedure well.    Adelene Idler MD [facility] 01/27/2018 3:33 PM

## 2018-01-31 ENCOUNTER — Ambulatory Visit (INDEPENDENT_AMBULATORY_CARE_PROVIDER_SITE_OTHER): Payer: BLUE CROSS/BLUE SHIELD | Admitting: Urology

## 2018-01-31 ENCOUNTER — Encounter: Payer: Self-pay | Admitting: Urology

## 2018-01-31 VITALS — BP 146/80 | HR 73 | Ht 68.0 in | Wt 170.0 lb

## 2018-01-31 DIAGNOSIS — N390 Urinary tract infection, site not specified: Secondary | ICD-10-CM | POA: Diagnosis not present

## 2018-01-31 LAB — MICROSCOPIC EXAMINATION: RBC, UA: NONE SEEN /hpf (ref 0–2)

## 2018-01-31 LAB — BLADDER SCAN AMB NON-IMAGING

## 2018-01-31 LAB — URINALYSIS, COMPLETE
BILIRUBIN UA: NEGATIVE
Glucose, UA: NEGATIVE
KETONES UA: NEGATIVE
Nitrite, UA: POSITIVE — AB
Protein, UA: NEGATIVE
RBC UA: NEGATIVE
SPEC GRAV UA: 1.02 (ref 1.005–1.030)
UUROB: 2 mg/dL — AB (ref 0.2–1.0)
pH, UA: 7.5 (ref 5.0–7.5)

## 2018-01-31 MED ORDER — TRIMETHOPRIM 100 MG PO TABS: 100.0000 mg | ORAL_TABLET | Freq: Every day | ORAL | 11 refills | Status: DC

## 2018-01-31 NOTE — Progress Notes (Signed)
01/31/2018 3:02 PM   Meredith Kelly [birth year] [id number]  Referring provider: Kerman Passey, MD [address] [place], [place] [address]  Chief Complaint  Patient presents with  . Recurrent UTI    7 week followup    HPI: I was consulted to assess the patient for recurrent urinary tract infections.  She then she has had 2 kidney infections with a high fever that came on suddenly in the last few months.  She was not having a lot of other symptoms otherwise.  It took about 3 days for the fever that was significant to normalize.  She describes an elevated white blood count.  The patient normally voids every 2 or 3 hours and gets up once or twice a night.  She normally does not get bladder infections.  The patient had a CT scan in November 2019 with left renal changes in keeping with pyelonephritis.  She had a positive culture.  A few months earlier she actually had a positive blood culture with a different organism but I did not see a urine culture from that encounter  Patient and I spoke about the pathophysiology of recurrent bladder infections.  Her residual today was 182 mL but it could represent a false positive based upon today's voiding pattern.  Reassess in 6 or 7 weeks on daily trimethoprim.  Probiotics discussed.  In the last 2 years she sometimes get low-grade fever and I think is not related.  Today She stable.  No infections.  Very pleased  PMH: History reviewed. No pertinent past medical history.  Surgical History: Past Surgical History:  Procedure Laterality Date  . WISDOM TOOTH EXTRACTION      Home Medications:  Allergies as of 01/31/2018   No Known Allergies     Medication List       Accurate as of January 31, 2018  3:02 PM. Always use your most recent med list.        Cranberry 425 MG Caps Take 1 capsule by mouth daily.   etonogestrel 68 MG Impl implant Commonly known as:  NEXPLANON 1 each by Subdermal route once.   MULTIVITAL  tablet Take by mouth.   trimethoprim 100 MG tablet Commonly known as:  TRIMPEX Take 1 tablet (100 mg total) by mouth daily.       Allergies: No Known Allergies  Family History: Family History  Problem Relation Age of Onset  . Lupus Maternal Grandmother   . Diabetes Maternal Grandmother   . Alzheimer's disease Paternal Grandfather     Social History:  reports that she has never smoked. She has never used smokeless tobacco. She reports previous drug use. She reports that she does not drink alcohol.  ROS: UROLOGY Frequent Urination?: No Hard to postpone urination?: No Burning/pain with urination?: No Get up at night to urinate?: Yes Leakage of urine?: No Urine stream starts and stops?: No Trouble starting stream?: No Do you have to strain to urinate?: No Blood in urine?: No Urinary tract infection?: Yes Sexually transmitted disease?: No Injury to kidneys or bladder?: No Painful intercourse?: No Weak stream?: No Currently pregnant?: No Vaginal bleeding?: No Last menstrual period?: n  Gastrointestinal Nausea?: No Vomiting?: No Indigestion/heartburn?: No Diarrhea?: No Constipation?: No  Constitutional Fever: No Night sweats?: No Weight loss?: No Fatigue?: No  Skin Skin rash/lesions?: No Itching?: No  Eyes Blurred vision?: No Double vision?: No  Ears/Nose/Throat Sore throat?: No Sinus problems?: No  Hematologic/Lymphatic Swollen glands?: No Easy bruising?: No  Cardiovascular Leg  swelling?: No Chest pain?: No  Respiratory Cough?: No Shortness of breath?: No  Endocrine Excessive thirst?: No  Musculoskeletal Back pain?: No Joint pain?: No  Neurological Headaches?: No Dizziness?: No  Psychologic Depression?: No Anxiety?: No  Physical Exam: BP (!) 146/80 (BP Location: Right Arm, Patient Position: Sitting)   Pulse 73   Ht 5\' 8"  (1.727 m)   Wt 77.1 kg   LMP 01/21/2018 (Approximate)   BMI 25.85 kg/m   Constitutional:  Alert and  oriented, No acute distress. HEENT: [place] AT, moist mucus membranes.  Trachea midline, no masses. Cardiovascular: No clubbing, cyanosis, or edema.   Laboratory Data: Lab Results  Component Value Date   WBC 15.4 (H) 11/30/2017   HGB 13.4 11/30/2017   HCT 42.7 11/30/2017   MCV 87.7 11/30/2017   PLT 227 11/30/2017    Lab Results  Component Value Date   CREATININE 0.90 11/30/2017    No results found for: PSA  No results found for: TESTOSTERONE  No results found for: HGBA1C  Urinalysis    Component Value Date/Time   COLORURINE YELLOW 11/30/2017 1606   APPEARANCEUR Cloudy (A) 12/13/2017 1346   LABSPEC 1.013 11/30/2017 1606   PHURINE 5.0 11/30/2017 1606   GLUCOSEU Negative 12/13/2017 1346   HGBUR LARGE (A) 11/30/2017 1606   BILIRUBINUR Negative 12/13/2017 1346   KETONESUR 20 (A) 11/30/2017 1606   PROTEINUR Negative 12/13/2017 1346   PROTEINUR 100 (A) 11/30/2017 1606   UROBILINOGEN 0.2 11/16/2017 1550   NITRITE Negative 12/13/2017 1346   NITRITE POSITIVE (A) 11/30/2017 1606   LEUKOCYTESUR Negative 12/13/2017 1346    Pertinent Imaging:   Assessment & Plan: Follow-up in 1 year And residual today was 4 mL  1. Recurrent UTI ss than 1 year - BLADDER SCAN AMB NON-IMAGING - Urinalysis, Complete   Return in about 1 year (around 02/01/2019).  Martina Sinner, MD  [facility] [address], [address] [place], [place] [address] [phone]

## 2018-02-09 ENCOUNTER — Encounter: Payer: Self-pay | Admitting: Family Medicine

## 2018-02-10 ENCOUNTER — Encounter: Payer: Self-pay | Admitting: Urology

## 2018-02-10 ENCOUNTER — Ambulatory Visit (INDEPENDENT_AMBULATORY_CARE_PROVIDER_SITE_OTHER): Payer: BLUE CROSS/BLUE SHIELD | Admitting: Urology

## 2018-02-10 VITALS — BP 126/86 | HR 130 | Temp 99.1°F | Resp 14 | Ht 68.0 in | Wt 170.0 lb

## 2018-02-10 DIAGNOSIS — N390 Urinary tract infection, site not specified: Secondary | ICD-10-CM | POA: Diagnosis not present

## 2018-02-10 MED ORDER — AMOXICILLIN-POT CLAVULANATE 875-125 MG PO TABS: 1.0000 | ORAL_TABLET | Freq: Two times a day (BID) | ORAL | 0 refills | Status: DC

## 2018-02-10 NOTE — Progress Notes (Signed)
02/10/2018 3:25 PM   Meredith Kelly [birth year] [id number]  Referring provider: Kerman Passey, MD [address] [place], [place] [address]  Chief Complaint  Patient presents with  . Dysuria   HPI: Meredith Kelly is a 23 yo F who returns today for the evaluation and management of rUTIs. Her last visit with Meredith Kelly was on 01/31/2018 with Dr. Sherron Monday.   She reports of 2 rUTIs in the last year and one was septic. She first noticed symptoms of lower back pain and odor that started in August 2019. She does not recall similar issues as a little kid. She also had a fever yesterday night reaching 99 F. She denies dysuria, constipatoin and diarrhea. She is currently on Trimethoprim prescribed by Dr. Sherron Monday.   She is sexually active. This occurred prior to being sexually active. She was taking cranberry tablets and discontinued it once she started antibiotics. She does not soak in bath tub.   She did try to hold herself from urinating prior to infection but has tried to go to bathroom as soon as possible from then on. She denies urinary incontinence.   She reports of drinking a lot of water, rarely soda (only when going out to eat), denies drinking coffee and tea.   Her UA shows 6-10 WBC and many bacteria.   Previous history:  CT scan in November 2019 with left renal changes in keeping with pyelonephritis. She had a positive culture. A few months earlier she actually had a positive blood culture with a different organism but I did not see a urine culture from that encounter.  PMH: No past medical history on file.  Surgical History: Past Surgical History:  Procedure Laterality Date  . WISDOM TOOTH EXTRACTION      Home Medications:  Allergies as of 02/10/2018   No Known Allergies     Medication List       Accurate as of February 10, 2018  3:25 PM. Always use your most recent med list.        amoxicillin-clavulanate 875-125 MG tablet Commonly known as:  AUGMENTIN Take  1 tablet by mouth every 12 (twelve) hours.   Cranberry 425 MG Caps Take 1 capsule by mouth daily.   etonogestrel 68 MG Impl implant Commonly known as:  NEXPLANON 1 each by Subdermal route once.   MULTIVITAL tablet Take by mouth.   trimethoprim 100 MG tablet Commonly known as:  TRIMPEX Take 1 tablet (100 mg total) by mouth daily.       Allergies: No Known Allergies  Family History: Family History  Problem Relation Age of Onset  . Lupus Maternal Grandmother   . Diabetes Maternal Grandmother   . Alzheimer's disease Paternal Grandfather     Social History:  reports that she has never smoked. She has never used smokeless tobacco. She reports previous drug use. She reports that she does not drink alcohol.  ROS: UROLOGY Frequent Urination?: No Hard to postpone urination?: Yes Burning/pain with urination?: No Leakage of urine?: No Urine stream starts and stops?: No Trouble starting stream?: No Do you have to strain to urinate?: No Blood in urine?: No Urinary tract infection?: Yes Sexually transmitted disease?: No Injury to kidneys or bladder?: No Painful intercourse?: No Weak stream?: No Currently pregnant?: No Vaginal bleeding?: Yes Last menstrual period?: 01/21/2018  Gastrointestinal Nausea?: Yes Vomiting?: No Indigestion/heartburn?: No Diarrhea?: No Constipation?: Yes  Constitutional Fever: Yes Night sweats?: No Weight loss?: No Fatigue?: No  Skin Skin rash/lesions?: No Itching?: No  Eyes Blurred vision?: No Double vision?: No  Ears/Nose/Throat Sore throat?: No Sinus problems?: No  Hematologic/Lymphatic Swollen glands?: No Easy bruising?: No  Cardiovascular Leg swelling?: No Chest pain?: No  Respiratory Cough?: No Shortness of breath?: No  Endocrine Excessive thirst?: No  Musculoskeletal Back pain?: Yes Joint pain?: No  Neurological Headaches?: Yes Dizziness?: No  Psychologic Depression?: No Anxiety?: No  Physical  Exam: BP 126/86   Pulse (!) 130   Temp 99.1 F (37.3 C)   Resp 14   Ht 5\' 8"  (1.727 m)   Wt 170 lb (77.1 kg)   LMP 01/21/2018 (Approximate) Comment: some spotting, just got Nexplanon replaced  BMI 25.85 kg/m   Constitutional:  Alert and oriented, No acute distress. HEENT: [place] AT, moist mucus membranes.  Trachea midline, no masses. Cardiovascular: No clubbing, cyanosis, or edema. Respiratory: Normal respiratory effort, no increased work of breathing. Skin: No rashes, bruises or suspicious lesions. Neurologic: Grossly intact, no focal deficits, moving all 4 extremities. Psychiatric: Normal mood and affect.  Laboratory Data:  Urinalysis  Her UA positive for 6-10 WBC and many bacteria.   Assessment & Plan:   1.rUTIs  - UA positive for 6-10 WBC and many bacteria - Urine culture sent - pending  - Discontinue Trimethoprim; start Augmentin; 1 tablet by mouth every 12 hours -  Re-start cranberry tablets, abstain from sexual activity and then VCUG depending on urine culture result   Return for pending urine culture .  Michiel Cowboy, PA-C  [facility] [address], [address] [place], [place] [address] [phone]  I, Meredith Kelly, am acting as a Neurosurgeon for [employer],  I have reviewed the above documentation for accuracy and completeness, and I agree with the above.    Michiel Cowboy, PA-C

## 2018-02-11 LAB — URINALYSIS, COMPLETE
BILIRUBIN UA: NEGATIVE
Glucose, UA: NEGATIVE
Nitrite, UA: NEGATIVE
SPEC GRAV UA: 1.025 (ref 1.005–1.030)
Urobilinogen, Ur: 2 mg/dL — ABNORMAL HIGH (ref 0.2–1.0)
pH, UA: 5.5 (ref 5.0–7.5)

## 2018-02-11 LAB — MICROSCOPIC EXAMINATION

## 2018-02-13 LAB — CULTURE, URINE COMPREHENSIVE

## 2018-03-03 ENCOUNTER — Telehealth: Payer: Self-pay | Admitting: Urology

## 2018-03-03 NOTE — Telephone Encounter (Signed)
Please call Meredith Kelly and remind her that we need her to have an office visit so that we can check her urine again and make plans for a VCUG.

## 2018-03-07 NOTE — Telephone Encounter (Signed)
LMOM for patient to call office to schedule OV with Carollee Herter , UA

## 2018-03-10 NOTE — Telephone Encounter (Signed)
[facility] notifying patient to call back and schedule an appointment for an OV to have a UA and see Shannon.

## 2018-03-30 ENCOUNTER — Ambulatory Visit: Payer: BLUE CROSS/BLUE SHIELD | Admitting: Urology

## 2018-05-11 ENCOUNTER — Other Ambulatory Visit: Payer: Self-pay

## 2018-11-21 ENCOUNTER — Encounter: Payer: PRIVATE HEALTH INSURANCE | Admitting: Family Medicine

## 2018-12-13 ENCOUNTER — Encounter: Payer: Self-pay | Admitting: Family Medicine

## 2019-01-30 ENCOUNTER — Ambulatory Visit: Payer: PRIVATE HEALTH INSURANCE | Admitting: Urology

## 2019-01-31 ENCOUNTER — Encounter: Payer: Self-pay | Admitting: Urology

## 2019-10-18 NOTE — Progress Notes (Signed)
[facility]    Provider:  Dr Lucia Gaskins Requesting Provider: Belva Agee, MD Primary Care Provider:  Patient, No Pcp Per  CC:  migraines  HPI:  Meredith Kelly is a 24 y.o. female here as requested by Ranae Pila, * for migraines. I reviewed Dr. Arlester Marker notes, epic and "care everywhere", and so no significant history documented on patient's migraines. She has had her migraines since she was 17, becoming more frequent, maybe hormonal not sure. Her aunt has migraines with autoimmune disorders. She has them more often when she has her period but can also get them anytime, no triggers known, she made some dietary changes, They happen very fast, behind her eye, she has to turn the lights off, unilateral, pulsating/pounding/throbbing around the eye, spreads tot he shoulders and maybe to the rest of the head, photo/phonophobia, light makes it worse, a dark room, worsening recently,  hurts to move, nausea. Tries heat and cold. She has no aura. The migraines can be severe. Blurry vision and vision changes when it is bad, she gets distraught and confused, bending over can make it worse she can;t even move. She wakes with the headaches in the morning or can wake in the middle of the night, but happen moreso in the evening. No medication overuse. Migraines can last 24-72, severe, at least 15 headache days a month and 12 migraine days a month that are severe or moderately severe, stress and blue light can make it worse. Ongoing at this severity and frequency for 10 months. No other focal neurologic deficits, associated symptoms, inciting events or modifiable factors.  Review of Systems: Patient complains of symptoms per HPI as well as the following symptoms: headaches. Pertinent negatives and positives per HPI. All others negative.   Social History   Socioeconomic History  . Marital status: Married    Spouse name: Not on file  . Number of children: 0  . Years of education: Not on  file  . Highest education level: Bachelor's degree (e.g., BA, AB, BS)  Occupational History  . Not on file  Tobacco Use  . Smoking status: Never Smoker  . Smokeless tobacco: Never Used  Vaping Use  . Vaping Use: Never used  Substance and Sexual Activity  . Alcohol use: Never  . Drug use: Not Currently  . Sexual activity: Yes    Birth control/protection: I.U.D.  Other Topics Concern  . Not on file  Social History Narrative   Lives at home with husband   Right handed   Caffeine: 1 cup/day   Social Determinants of Health   Financial Resource Strain:   . Difficulty of Paying Living Expenses: Not on file  Food Insecurity:   . Worried About Programme researcher, broadcasting/film/video in the Last Year: Not on file  . Ran Out of Food in the Last Year: Not on file  Transportation Needs:   . Lack of Transportation (Medical): Not on file  . Lack of Transportation (Non-Medical): Not on file  Physical Activity:   . Days of Exercise per Week: Not on file  . Minutes of Exercise per Session: Not on file  Stress:   . Feeling of Stress : Not on file  Social Connections:   . Frequency of Communication with Friends and Family: Not on file  . Frequency of Social Gatherings with Friends and Family: Not on file  . Attends Religious Services: Not on file  . Active Member of Clubs or Organizations: Not on file  . Attends Club or  Organization Meetings: Not on file  . Marital Status: Not on file  Intimate Partner Violence:   . Fear of Current or Ex-Partner: Not on file  . Emotionally Abused: Not on file  . Physically Abused: Not on file  . Sexually Abused: Not on file    Family History  Problem Relation Age of Onset  . Lupus Maternal Grandmother   . Diabetes Maternal Grandmother   . Alzheimer's disease Paternal Grandfather   . Ankylosing spondylitis Maternal Aunt   . Migraines Maternal Aunt   . Other Maternal Aunt        lesions on brain, unknown details, thought MS originally     Past Medical History:    Diagnosis Date  . History of elevated antinuclear antibody (ANA)    saw Rheumatology but has not followed up   . Migraine     Patient Active Problem List   Diagnosis Date Noted  . Chronic migraine without aura without status migrainosus, not intractable 10/19/2019  . ANA positive 08/27/2017  . Dry eyes 08/27/2017  . Decreased circulation in fingers or toes 08/17/2017  . Allergic rhinitis due to animal hair and dander 08/17/2017    Past Surgical History:  Procedure Laterality Date  . WISDOM TOOTH EXTRACTION      Current Outpatient Medications  Medication Sig Dispense Refill  . Butalbital-APAP-Caffeine 50-300-40 MG CAPS Take 1 capsule by mouth every 4 (four) hours.    . Cranberry 425 MG CAPS Take 1 capsule by mouth daily.    Marland Kitchen etonogestrel (NEXPLANON) 68 MG IMPL implant 1 each by Subdermal route once.    Marland Kitchen levonorgestrel (MIRENA) 20 MCG/24HR IUD 1 each by Intrauterine route once.    . Multiple Vitamins-Minerals (MULTIVITAL) tablet Take by mouth.    . trimethoprim (TRIMPEX) 100 MG tablet Take 1 tablet (100 mg total) by mouth daily. 30 tablet 11  . ondansetron (ZOFRAN-ODT) 4 MG disintegrating tablet Take 1 tablet (4 mg total) by mouth every 8 (eight) hours as needed for nausea. 30 tablet 3  . rizatriptan (MAXALT-MLT) 10 MG disintegrating tablet Take 1 tablet (10 mg total) by mouth as needed for migraine. May repeat in 2 hours if needed. May take with Rizatriptan. 9 tablet 11  . topiramate (TOPAMAX) 50 MG tablet Start with 50mg  at bedtime(1 tab) and in 2 weeks, if no side effects, increase to 100mg (2 tabs) 30 tablet 3   Current Facility-Administered Medications  Medication Dose Route Frequency Provider Last Rate Last Admin  . etonogestrel (NEXPLANON) implant 68 mg  68 mg Subdermal Once , MD        Allergies as of 10/19/2019  . (No Known Allergies)    Vitals: BP 128/84 (BP Location: Right Arm, Patient Position: Sitting)   Pulse 74   Ht 5\' 8"  (1.727 m)    Wt 199 lb (90.3 kg)   BMI 30.26 kg/m  Last Weight:  Wt Readings from Last 1 Encounters:  10/19/19 199 lb (90.3 kg)   Last Height:   Ht Readings from Last 1 Encounters:  10/19/19 5\' 8"  (1.727 m)     Physical exam: Exam: Gen: NAD, conversant, well nourised, well groomed                     CV: RRR, no MRG. No Carotid Bruits. No peripheral edema, warm, nontender Eyes: Conjunctivae clear without exudates or hemorrhage  Neuro: Detailed Neurologic Exam  Speech:    Speech is normal; fluent and spontaneous with normal comprehension.  Cognition:    The patient is oriented to person, place, and time;     recent and remote memory intact;     language fluent;     normal attention, concentration,     fund of knowledge Cranial Nerves:    The pupils are equal, round, and reactive to light. The fundi are flat. Visual fields are full to finger confrontation. Extraocular movements are intact. Trigeminal sensation is intact and the muscles of mastication are normal. The face is symmetric. The palate elevates in the midline. Hearing intact. Voice is normal. Shoulder shrug is normal. The tongue has normal motion without fasciculations.   Coordination:    No dysmetria or ataxia   Gait:  normal native gait  Motor Observation:    No asymmetry, no atrophy, and no involuntary movements noted. Tone:    Normal muscle tone.    Posture:    Posture is normal. normal erect    Strength:    Strength is V/V in the upper and lower limbs.      Sensation: intact to LT     Reflex Exam:  DTR's:    Deep tendon reflexes in the upper and lower extremities are normal bilaterally.   Toes:    The toes are downgoing bilaterally.   Clonus:    Clonus is absent.    Assessment/Plan:  Lovely 24 year old with likely chronic migraines but given symptoms she should have a thorough evaluation.   MRI of the brain: MRI brain due to concerning symptoms of morning headaches, positional headaches,vision changes   to look for space occupying mass, chiari or intracranial hypertension (pseudotumor).  Prevention: Start Topiramate start at 50mg  and then go up to 100mg (discussed teratogenicity do not get pregnant) Acute: Rizatriptan and ondansetron Next may consider propranolol or nortriptyline and also discussed CGRP injections, botox and others  Orders Placed This Encounter  Procedures  . MR BRAIN W WO CONTRAST  . Comprehensive metabolic panel  . CBC  . TSH   Meds ordered this encounter  Medications  . rizatriptan (MAXALT-MLT) 10 MG disintegrating tablet    Sig: Take 1 tablet (10 mg total) by mouth as needed for migraine. May repeat in 2 hours if needed. May take with Rizatriptan.    Dispense:  9 tablet    Refill:  11  . topiramate (TOPAMAX) 50 MG tablet    Sig: Start with 50mg  at bedtime(1 tab) and in 2 weeks, if no side effects, increase to 100mg (2 tabs)    Dispense:  30 tablet    Refill:  3  . ondansetron (ZOFRAN-ODT) 4 MG disintegrating tablet    Sig: Take 1 tablet (4 mg total) by mouth every 8 (eight) hours as needed for nausea.    Dispense:  30 tablet    Refill:  3    Cc: , *,  Patient, No Pcp Per  , MD  [facility] [address] [place], Naomie Dean [facility]  Phone [phone] Fax [phone]

## 2019-10-19 ENCOUNTER — Ambulatory Visit (INDEPENDENT_AMBULATORY_CARE_PROVIDER_SITE_OTHER): Payer: 59 | Admitting: Neurology

## 2019-10-19 ENCOUNTER — Encounter: Payer: Self-pay | Admitting: Neurology

## 2019-10-19 ENCOUNTER — Telehealth: Payer: Self-pay | Admitting: Neurology

## 2019-10-19 ENCOUNTER — Other Ambulatory Visit: Payer: Self-pay

## 2019-10-19 VITALS — BP 128/84 | HR 74 | Ht 68.0 in | Wt 199.0 lb

## 2019-10-19 DIAGNOSIS — H539 Unspecified visual disturbance: Secondary | ICD-10-CM

## 2019-10-19 DIAGNOSIS — R51 Headache with orthostatic component, not elsewhere classified: Secondary | ICD-10-CM | POA: Diagnosis not present

## 2019-10-19 DIAGNOSIS — G43709 Chronic migraine without aura, not intractable, without status migrainosus: Secondary | ICD-10-CM | POA: Diagnosis not present

## 2019-10-19 DIAGNOSIS — R519 Headache, unspecified: Secondary | ICD-10-CM

## 2019-10-19 MED ORDER — ONDANSETRON 4 MG PO TBDP: 4.0000 mg | ORAL_TABLET | Freq: Three times a day (TID) | ORAL | 3 refills | Status: AC | PRN

## 2019-10-19 MED ORDER — TOPIRAMATE 50 MG PO TABS: ORAL_TABLET | ORAL | 3 refills | Status: AC

## 2019-10-19 MED ORDER — RIZATRIPTAN BENZOATE 10 MG PO TBDP: 10.0000 mg | ORAL_TABLET | ORAL | 11 refills | Status: AC | PRN

## 2019-10-19 NOTE — Telephone Encounter (Signed)
[employer] order sent to GI. They will obtain the auth and reach out to the patient to schedule.  °

## 2019-10-19 NOTE — Patient Instructions (Addendum)
MRI of the brain  Prevention: Topiramate Acute management: Rizatriptan: Please take one tablet at the onset of your headache. If it does not improve the symptoms please take one additional tablet. Do not take more then 2 tablets in 24hrs. Do not take use more then 2 to 3 times in a week. Ondansetron: Take with Rizatriptan or alone for nausea or vertigo Next may consider propranolol or nortriptyline and also discussed CGRP injections, botox and others  Rizatriptan disintegrating tablets What is this medicine? RIZATRIPTAN (rye za TRIP tan) is used to treat migraines with or without aura. An aura is a strange feeling or visual disturbance that warns you of an attack. It is not used to prevent migraines. This medicine may be used for other purposes; ask your health care provider or pharmacist if you have questions. COMMON BRAND NAME(S): Maxalt-MLT What should I tell my health care provider before I take this medicine? They need to know if you have any of these conditions:  cigarette smoker  circulation problems in fingers and toes  diabetes  heart disease  high blood pressure  high cholesterol  history of irregular heartbeat  history of stroke  kidney disease  liver disease  stomach or intestine problems  an unusual or allergic reaction to rizatriptan, other medicines, foods, dyes, or preservatives  pregnant or trying to get pregnant  breast-feeding How should I use this medicine? Take this medicine by mouth. Follow the directions on the prescription label. Leave the tablet in the sealed blister pack until you are ready to take it. With dry hands, open the blister and gently remove the tablet. If the tablet breaks or crumbles, throw it away and take a new tablet out of the blister pack. Place the tablet in the mouth and allow it to dissolve, and then swallow. Do not cut, crush, or chew this medicine. You do not need water to take this medicine. Do not take it more often than  directed. Talk to your pediatrician regarding the use of this medicine in children. While this drug may be prescribed for children as young as 6 years for selected conditions, precautions do apply. Overdosage: If you think you have taken too much of this medicine contact a poison control center or emergency room at once. NOTE: This medicine is only for you. Do not share this medicine with others. What if I miss a dose? This does not apply. This medicine is not for regular use. What may interact with this medicine? Do not take this medicine with any of the following medicines:  certain medicines for migraine headache like almotriptan, eletriptan, frovatriptan, naratriptan, rizatriptan, sumatriptan, zolmitriptan  ergot alkaloids like dihydroergotamine, ergonovine, ergotamine, methylergonovine  MAOIs like Carbex, Eldepryl, Marplan, Nardil, and Parnate This medicine may also interact with the following medications:  certain medicines for depression, anxiety, or psychotic disorders  propranolol This list may not describe all possible interactions. Give your health care provider a list of all the medicines, herbs, non-prescription drugs, or dietary supplements you use. Also tell them if you smoke, drink alcohol, or use illegal drugs. Some items may interact with your medicine. What should I watch for while using this medicine? Visit your healthcare professional for regular checks on your progress. Tell your healthcare professional if your symptoms do not start to get better or if they get worse. You may get drowsy or dizzy. Do not drive, use machinery, or do anything that needs mental alertness until you know how this medicine affects you. Do not  stand up or sit up quickly, especially if you are an older patient. This reduces the risk of dizzy or fainting spells. Alcohol may interfere with the effect of this medicine. Your mouth may get dry. Chewing sugarless gum or sucking hard candy and drinking  plenty of water may help. Contact your healthcare professional if the problem does not go away or is severe. If you take migraine medicines for 10 or more days a month, your migraines may get worse. Keep a diary of headache days and medicine use. Contact your healthcare professional if your migraine attacks occur more frequently. What side effects may I notice from receiving this medicine? Side effects that you should report to your doctor or health care professional as soon as possible:  allergic reactions like skin rash, itching or hives, swelling of the face, lips, or tongue  chest pain or chest tightness  signs and symptoms of a dangerous change in heartbeat or heart rhythm like chest pain; dizziness; fast, irregular heartbeat; palpitations; feeling faint or lightheaded; falls; breathing problems  signs and symptoms of a stroke like changes in vision; confusion; trouble speaking or understanding; severe headaches; sudden numbness or weakness of the face, arm or leg; trouble walking; dizziness; loss of balance or coordination  signs and symptoms of serotonin syndrome like irritable; confusion; diarrhea; fast or irregular heartbeat; muscle twitching; stiff muscles; trouble walking; sweating; high fever; seizures; chills; vomiting Side effects that usually do not require medical attention (report to your doctor or health care professional if they continue or are bothersome):  diarrhea  dizziness  drowsiness  dry mouth  headache  nausea, vomiting  pain, tingling, numbness in the hands or feet  stomach pain This list may not describe all possible side effects. Call your doctor for medical advice about side effects. You may report side effects to FDA at 1-800-FDA-1088. Where should I keep my medicine? Keep out of the reach of children. Store at room temperature between 15 and 30 degrees C (59 and 86 degrees F). Protect from light and moisture. Throw away any unused medicine after the  expiration date. NOTE: This sheet is a summary. It may not cover all possible information. If you have questions about this medicine, talk to your doctor, pharmacist, or health care provider.  2020 Elsevier/Gold Standard (2017-07-20 14:58:08) Topiramate tablets What is this medicine? TOPIRAMATE (toe PYRE a mate) is used to treat seizures in adults or children with epilepsy. It is also used for the prevention of migraine headaches. This medicine may be used for other purposes; ask your health care provider or pharmacist if you have questions. COMMON BRAND NAME(S): Topamax, Topiragen What should I tell my health care provider before I take this medicine? They need to know if you have any of these conditions:  bleeding disorders  kidney disease  lung or breathing disease, like asthma  suicidal thoughts, plans, or attempt; a previous suicide attempt by you or a family member  an unusual or allergic reaction to topiramate, other medicines, foods, dyes, or preservatives  pregnant or trying to get pregnant  breast-feeding How should I use this medicine? Take this medicine by mouth with a glass of water. Follow the directions on the prescription label. Do not cut, crush or chew this medicine. Swallow the tablets whole. You can take it with or without food. If it upsets your stomach, take it with food. Take your medicine at regular intervals. Do not take it more often than directed. Do not stop taking except on  your doctor's advice. A special MedGuide will be given to you by the pharmacist with each prescription and refill. Be sure to read this information carefully each time. Talk to your pediatrician regarding the use of this medicine in children. While this drug may be prescribed for children as young as 672 years of age for selected conditions, precautions do apply. Overdosage: If you think you have taken too much of this medicine contact a poison control center or emergency room at  once. NOTE: This medicine is only for you. Do not share this medicine with others. What if I miss a dose? If you miss a dose, take it as soon as you can. If your next dose is to be taken in less than 6 hours, then do not take the missed dose. Take the next dose at your regular time. Do not take double or extra doses. What may interact with this medicine? This medicine may interact with the following medications:  acetazolamide  alcohol  antihistamines for allergy, cough, and cold  aspirin and aspirin-like medicines  atropine  birth control pills  certain medicines for anxiety or sleep  certain medicines for bladder problems like oxybutynin, tolterodine  certain medicines for depression like amitriptyline, fluoxetine, sertraline  certain medicines for seizures like carbamazepine, phenobarbital, phenytoin, primidone, valproic acid, zonisamide  certain medicines for stomach problems like dicyclomine, hyoscyamine  certain medicines for travel sickness like scopolamine  certain medicines for Parkinson's disease like benztropine, trihexyphenidyl  certain medicines that treat or prevent blood clots like warfarin, enoxaparin, dalteparin, apixaban, dabigatran, and rivaroxaban  digoxin  general anesthetics like halothane, isoflurane, methoxyflurane, propofol  hydrochlorothiazide  ipratropium  lithium  medicines that relax muscles for surgery  metformin  narcotic medicines for pain  NSAIDs, medicines for pain and inflammation, like ibuprofen or naproxen  phenothiazines like chlorpromazine, mesoridazine, prochlorperazine, thioridazine  pioglitazone This list may not describe all possible interactions. Give your health care provider a list of all the medicines, herbs, non-prescription drugs, or dietary supplements you use. Also tell them if you smoke, drink alcohol, or use illegal drugs. Some items may interact with your medicine. What should I watch for while using this  medicine? Visit your doctor or health care professional for regular checks on your progress. Tell your health care professional if your symptoms do not start to get better or if they get worse. Do not stop taking except on your health care professional's advice. You may develop a severe reaction. Your health care professional will tell you how much medicine to take. Wear a medical ID bracelet or chain. Carry a card that describes your disease and details of your medicine and dosage times. This medicine can reduce the response of your body to heat or cold. Dress warm in cold weather and stay hydrated in hot weather. If possible, avoid extreme temperatures like saunas, hot tubs, very hot or cold showers, or activities that can cause dehydration such as vigorous exercise. Check with your health care professional if you have severe diarrhea, nausea, and vomiting, or if you sweat a lot. The loss of too much body fluid may make it dangerous for you to take this medicine. You may get drowsy or dizzy. Do not drive, use machinery, or do anything that needs mental alertness until you know how this medicine affects you. Do not stand up or sit up quickly, especially if you are an older patient. This reduces the risk of dizzy or fainting spells. Alcohol may interfere with the effect of this  medicine. Avoid alcoholic drinks. Tell your health care professional right away if you have any change in your eyesight. Patients and their families should watch out for new or worsening depression or thoughts of suicide. Also watch out for sudden changes in feelings such as feeling anxious, agitated, panicky, irritable, hostile, aggressive, impulsive, severely restless, overly excited and hyperactive, or not being able to sleep. If this happens, especially at the beginning of treatment or after a change in dose, call your healthcare professional. This medicine may cause serious skin reactions. They can happen weeks to months after  starting the medicine. Contact your health care provider right away if you notice fevers or flu-like symptoms with a rash. The rash may be red or purple and then turn into blisters or peeling of the skin. Or, you might notice a red rash with swelling of the face, lips or lymph nodes in your neck or under your arms. Birth control may not work properly while you are taking this medicine. Talk to your health care professional about using an extra method of birth control. Women should inform their health care professional if they wish to become pregnant or think they might be pregnant. There is a potential for serious side effects and harm to an unborn child. Talk to your health care professional for more information. What side effects may I notice from receiving this medicine? Side effects that you should report to your doctor or health care professional as soon as possible:  allergic reactions like skin rash, itching or hives, swelling of the face, lips, or tongue  blood in the urine  changes in vision  confusion  loss of memory  pain in lower back or side  pain when urinating  redness, blistering, peeling or loosening of the skin, including inside the mouth  signs and symptoms of bleeding such as bloody or black, tarry stools; red or dark brown urine; spitting up blood or brown material that looks like coffee grounds; red spots on the skin; unusual bruising or bleeding from the eyes, gums, or nose  signs and symptoms of increased acid in the body like breathing fast; fast heartbeat; headache; confusion; unusually weak or tired; nausea, vomiting  suicidal thoughts, mood changes  trouble speaking or understanding  unusual sweating  unusually weak or tired Side effects that usually do not require medical attention (report to your doctor or health care professional if they continue or are bothersome):  dizziness  drowsiness  fever  loss of appetite  nausea, vomiting  pain,  tingling, numbness in the hands or feet  stomach pain  tiredness  upset stomach This list may not describe all possible side effects. Call your doctor for medical advice about side effects. You may report side effects to FDA at 1-800-FDA-1088. Where should I keep my medicine? Keep out of the reach of children. Store at room temperature between 15 and 30 degrees C (59 and 86 degrees F). Throw away any unused medicine after the expiration date. NOTE: This sheet is a summary. It may not cover all possible information. If you have questions about this medicine, talk to your doctor, pharmacist, or health care provider.  2020 Elsevier/Gold Standard (2018-08-04 15:07:20)  Nortriptyline capsules What is this medicine? NORTRIPTYLINE (nor TRIP ti leen) is used to treat depression. This medicine may be used for other purposes; ask your health care provider or pharmacist if you have questions. COMMON BRAND NAME(S): Aventyl, Pamelor What should I tell my health care provider before I take this  medicine? They need to know if you have any of these conditions:  bipolar disorder  Brugada syndrome  difficulty passing urine  glaucoma  heart disease  if you drink alcohol  liver disease  schizophrenia  seizures  suicidal thoughts, plans or attempt; a previous suicide attempt by you or a family member  thyroid disease  an unusual or allergic reaction to nortriptyline, other tricyclic antidepressants, other medicines, foods, dyes, or preservatives  pregnant or trying to get pregnant  breast-feeding How should I use this medicine? Take this medicine by mouth with a glass of water. Follow the directions on the prescription label. Take your doses at regular intervals. Do not take it more often than directed. Do not stop taking this medicine suddenly except upon the advice of your doctor. Stopping this medicine too quickly may cause serious side effects or your condition may worsen. A  special MedGuide will be given to you by the pharmacist with each prescription and refill. Be sure to read this information carefully each time. Talk to your pediatrician regarding the use of this medicine in children. Special care may be needed. Overdosage: If you think you have taken too much of this medicine contact a poison control center or emergency room at once. NOTE: This medicine is only for you. Do not share this medicine with others. What if I miss a dose? If you miss a dose, take it as soon as you can. If it is almost time for your next dose, take only that dose. Do not take double or extra doses. What may interact with this medicine? Do not take this medicine with any of the following medications:  cisapride  dronedarone  linezolid  MAOIs like Carbex, Eldepryl, Marplan, Nardil, and Parnate  methylene blue (injected into a vein)  pimozide  thioridazine This medicine may also interact with the following medications:  alcohol  antihistamines for allergy, cough, and cold  atropine  certain medicines for bladder problems like oxybutynin, tolterodine  certain medicines for depression like amitriptyline, fluoxetine, sertraline  certain medicines for Parkinson's disease like benztropine, trihexyphenidyl  certain medicines for stomach problems like dicyclomine, hyoscyamine  certain medicines for travel sickness like scopolamine  chlorpropamide  cimetidine  ipratropium  other medicines that prolong the QT interval (an abnormal heart rhythm) like dofetilide  other medicines that can cause serotonin syndrome like St. John's Wort, fentanyl, lithium, tramadol, tryptophan, buspirone, and some medicines for headaches like sumatriptan or rizatriptan  quinidine  reserpine  thyroid medicine This list may not describe all possible interactions. Give your health care provider a list of all the medicines, herbs, non-prescription drugs, or dietary supplements you use.  Also tell them if you smoke, drink alcohol, or use illegal drugs. Some items may interact with your medicine. What should I watch for while using this medicine? Tell your doctor if your symptoms do not get better or if they get worse. Visit your doctor or health care professional for regular checks on your progress. Because it may take several weeks to see the full effects of this medicine, it is important to continue your treatment as prescribed by your doctor. Patients and their families should watch out for new or worsening thoughts of suicide or depression. Also watch out for sudden changes in feelings such as feeling anxious, agitated, panicky, irritable, hostile, aggressive, impulsive, severely restless, overly excited and hyperactive, or not being able to sleep. If this happens, especially at the beginning of treatment or after a change in dose, call your  health care professional. Bonita Quin may get drowsy or dizzy. Do not drive, use machinery, or do anything that needs mental alertness until you know how this medicine affects you. Do not stand or sit up quickly, especially if you are an older patient. This reduces the risk of dizzy or fainting spells. Alcohol may interfere with the effect of this medicine. Avoid alcoholic drinks. Do not treat yourself for coughs, colds, or allergies without asking your doctor or health care professional for advice. Some ingredients can increase possible side effects. Your mouth may get dry. Chewing sugarless gum or sucking hard candy, and drinking plenty of water may help. Contact your doctor if the problem does not go away or is severe. This medicine may cause dry eyes and blurred vision. If you wear contact lenses you may feel some discomfort. Lubricating drops may help. See your eye doctor if the problem does not go away or is severe. This medicine can cause constipation. Try to have a bowel movement at least every 2 to 3 days. If you do not have a bowel movement for 3  days, call your doctor or health care professional. This medicine can make you more sensitive to the sun. Keep out of the sun. If you cannot avoid being in the sun, wear protective clothing and use sunscreen. Do not use sun lamps or tanning beds/booths. What side effects may I notice from receiving this medicine? Side effects that you should report to your doctor or health care professional as soon as possible:  allergic reactions like skin rash, itching or hives, swelling of the face, lips, or tongue  anxious  breathing problems  changes in vision  confusion  elevated mood, decreased need for sleep, racing thoughts, impulsive behavior  eye pain  fast, irregular heartbeat  feeling faint or lightheaded, falls  feeling agitated, angry, or irritable  fever with increased sweating  hallucination, loss of contact with reality  seizures  stiff muscles  suicidal thoughts or other mood changes  tingling, pain, or numbness in the feet or hands  trouble passing urine or change in the amount of urine  trouble sleeping  unusually weak or tired  vomiting  yellowing of the eyes or skin Side effects that usually do not require medical attention (report to your doctor or health care professional if they continue or are bothersome):  change in sex drive or performance  change in appetite or weight  constipation  dizziness  dry mouth  nausea  tired  tremors  upset stomach This list may not describe all possible side effects. Call your doctor for medical advice about side effects. You may report side effects to FDA at 1-800-FDA-1088. Where should I keep my medicine? Keep out of the reach of children. Store at room temperature between 15 and 30 degrees C (59 and 86 degrees F). Keep container tightly closed. Throw away any unused medicine after the expiration date. NOTE: This sheet is a summary. It may not cover all possible information. If you have questions about  this medicine, talk to your doctor, pharmacist, or health care provider.  2020 Elsevier/Gold Standard (2017-12-28 13:24:58) Propranolol Tablets What is this medicine? PROPRANOLOL (proe PRAN oh lole) is a beta blocker. It decreases the amount of work your heart has to do and helps your heart beat regularly. It treats high blood pressure and/or prevent chest pain (also called angina). It is also used after a heart attack to prevent a second one. This medicine may be used for other purposes;  ask your health care provider or pharmacist if you have questions. COMMON BRAND NAME(S): Inderal What should I tell my health care provider before I take this medicine? They need to know if you have any of these conditions:  circulation problems or blood vessel disease  diabetes  history of heart attack or heart disease, vasospastic angina  kidney disease  liver disease  lung or breathing disease, like asthma or emphysema  pheochromocytoma  slow heart rate  thyroid disease  an unusual or allergic reaction to propranolol, other beta-blockers, medicines, foods, dyes, or preservatives  pregnant or trying to get pregnant  breast-feeding How should I use this medicine? Take this drug by mouth. Take it as directed on the prescription label at the same time every day. Keep taking it unless your health care provider tells you to stop. Talk to your health care provider about the use of this drug in children. Special care may be needed. Overdosage: If you think you have taken too much of this medicine contact a poison control center or emergency room at once. NOTE: This medicine is only for you. Do not share this medicine with others. What if I miss a dose? If you miss a dose, take it as soon as you can. If it is almost time for your next dose, take only that dose. Do not take double or extra doses. What may interact with this medicine? Do not take this medicine with any of the following  medications:  feverfew  phenothiazines like chlorpromazine, mesoridazine, prochlorperazine, thioridazine This medicine may also interact with the following medications:  aluminum hydroxide gel  antipyrine  antiviral medicines for HIV or AIDS  barbiturates like phenobarbital  certain medicines for blood pressure, heart disease, irregular heart beat  cimetidine  ciprofloxacin  diazepam  fluconazole  haloperidol  isoniazid  medicines for cholesterol like cholestyramine or colestipol  medicines for mental depression  medicines for migraine headache like almotriptan, eletriptan, frovatriptan, naratriptan, rizatriptan, sumatriptan, zolmitriptan  NSAIDs, medicines for pain and inflammation, like ibuprofen or naproxen  phenytoin  rifampin  teniposide  theophylline  thyroid medicines  tolbutamide  warfarin  zileuton This list may not describe all possible interactions. Give your health care provider a list of all the medicines, herbs, non-prescription drugs, or dietary supplements you use. Also tell them if you smoke, drink alcohol, or use illegal drugs. Some items may interact with your medicine. What should I watch for while using this medicine? Visit your doctor or health care professional for regular check ups. Check your blood pressure and pulse rate regularly. Ask your health care professional what your blood pressure and pulse rate should be, and when you should contact them. You may get drowsy or dizzy. Do not drive, use machinery, or do anything that needs mental alertness until you know how this drug affects you. Do not stand or sit up quickly, especially if you are an older patient. This reduces the risk of dizzy or fainting spells. Alcohol can make you more drowsy and dizzy. Avoid alcoholic drinks. This medicine may increase blood sugar. Ask your healthcare provider if changes in diet or medicines are needed if you have diabetes. Do not treat yourself for  coughs, colds, or pain while you are taking this medicine without asking your doctor or health care professional for advice. Some ingredients may increase your blood pressure. What side effects may I notice from receiving this medicine? Side effects that you should report to your doctor or health care professional as soon  as possible:  allergic reactions like skin rash, itching or hives, swelling of the face, lips, or tongue  breathing problems  cold hands or feet  difficulty sleeping, nightmares  dry peeling skin  hallucinations  muscle cramps or weakness   signs and symptoms of high blood sugar such as being more thirsty or hungry or having to urinate more than normal. You may also feel very tired or have blurry vision.  slow heart rate  swelling of the legs and ankles  vomiting Side effects that usually do not require medical attention (report to your doctor or health care professional if they continue or are bothersome):  change in sex drive or performance  diarrhea  dry sore eyes  hair loss  nausea  weak or tired This list may not describe all possible side effects. Call your doctor for medical advice about side effects. You may report side effects to FDA at 1-800-FDA-1088. Where should I keep my medicine? Keep out of the reach of children and pets. Store at room temperature between 20 and 25 degrees C (68 and 77 degrees F). Protect from light. Throw away any unused drug after the expiration date. NOTE: This sheet is a summary. It may not cover all possible information. If you have questions about this medicine, talk to your doctor, pharmacist, or health care provider.  2020 [employer] Standard (2018-08-12 19:25:51)

## 2019-10-20 LAB — COMPREHENSIVE METABOLIC PANEL
ALT: 28 IU/L (ref 0–32)
AST: 22 IU/L (ref 0–40)
Albumin/Globulin Ratio: 1.9 (ref 1.2–2.2)
Albumin: 4.8 g/dL (ref 3.9–5.0)
Alkaline Phosphatase: 85 IU/L (ref 44–121)
BUN/Creatinine Ratio: 16 (ref 9–23)
BUN: 12 mg/dL (ref 6–20)
Bilirubin Total: 0.4 mg/dL (ref 0.0–1.2)
CO2: 25 mmol/L (ref 20–29)
Calcium: 9.8 mg/dL (ref 8.7–10.2)
Chloride: 101 mmol/L (ref 96–106)
Creatinine, Ser: 0.75 mg/dL (ref 0.57–1.00)
GFR calc Af Amer: 131 mL/min/{1.73_m2} (ref >59)
GFR calc non Af Amer: 114 mL/min/{1.73_m2} (ref >59)
Globulin, Total: 2.5 g/dL (ref 1.5–4.5)
Glucose: 95 mg/dL (ref 65–99)
Potassium: 4.5 mmol/L (ref 3.5–5.2)
Sodium: 139 mmol/L (ref 134–144)
Total Protein: 7.3 g/dL (ref 6.0–8.5)

## 2019-10-20 LAB — CBC
Hematocrit: 45.7 % (ref 34.0–46.6)
Hemoglobin: 14.7 g/dL (ref 11.1–15.9)
MCH: 29.1 pg (ref 26.6–33.0)
MCHC: 32.2 g/dL (ref 31.5–35.7)
MCV: 91 fL (ref 79–97)
Platelets: 288 10*3/uL (ref 150–450)
RBC: 5.05 x10E6/uL (ref 3.77–5.28)
RDW: 12.9 % (ref 11.7–15.4)
WBC: 6.5 10*3/uL (ref 3.4–10.8)

## 2019-10-20 LAB — TSH: TSH: 0.984 u[IU]/mL (ref 0.450–4.500)

## 2019-10-23 NOTE — Telephone Encounter (Signed)
Rutherford Nail: [id number] (exp 10/20/19 to 01/18/20) patient is scheduled at [facility] for 10/31/19.

## 2019-10-31 ENCOUNTER — Ambulatory Visit
Admission: RE | Admit: 2019-10-31 | Discharge: 2019-10-31 | Disposition: A | Discharge status: Home / Self Care | Payer: Managed Care, Other (non HMO) | Source: Ambulatory Visit | Attending: Neurology | Admitting: Neurology

## 2019-10-31 ENCOUNTER — Other Ambulatory Visit: Payer: Self-pay

## 2019-10-31 DIAGNOSIS — H539 Unspecified visual disturbance: Secondary | ICD-10-CM

## 2019-10-31 DIAGNOSIS — R519 Headache, unspecified: Secondary | ICD-10-CM

## 2019-10-31 DIAGNOSIS — R51 Headache with orthostatic component, not elsewhere classified: Secondary | ICD-10-CM

## 2019-10-31 MED ORDER — GADOBENATE DIMEGLUMINE 529 MG/ML IV SOLN
19.0000 mL | Freq: Once | INTRAVENOUS | Status: AC | PRN
  Administered 2019-10-31: 19 mL via INTRAVENOUS

## 2020-01-23 ENCOUNTER — Encounter: Payer: Self-pay | Admitting: Neurology

## 2020-01-23 ENCOUNTER — Telehealth: Payer: Self-pay | Admitting: *Deleted

## 2020-01-23 ENCOUNTER — Ambulatory Visit: Payer: 59 | Admitting: Neurology

## 2020-01-23 NOTE — Telephone Encounter (Signed)
Pt no showed follow up today. This is her first no-show.

## 2020-02-21 NOTE — Telephone Encounter (Signed)
Nexplanon rcvd/charged 01/27/2018

## 2020-09-29 IMAGING — CT CT ABD-PELV W/ CM
2 of 4 series · 16 of 46 positions shown, 18 images · IV contrast (APPLIED)
Comparison: None.

CLINICAL DATA: Left-sided abdominal pain. History of renal
infections.

EXAM:
CT ABDOMEN AND PELVIS WITH CONTRAST
TECHNIQUE: Multidetector CT imaging of the abdomen and pelvis was performed
using the standard protocol following bolus administration of
intravenous contrast.
CONTRAST:  100mL OMNIPAQUE IOHEXOL 300 MG/ML  SOLN

[Series 3: abdomen 5.0 · axial · 0.73mm/px · z∈[+640,+1060]mm · 13 of 94 slices shown, 15 images]
[im 5/94  soft-tissue]
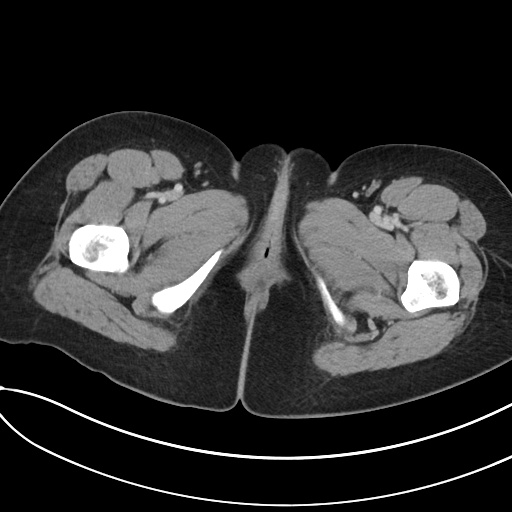
[im 5/94  bone]
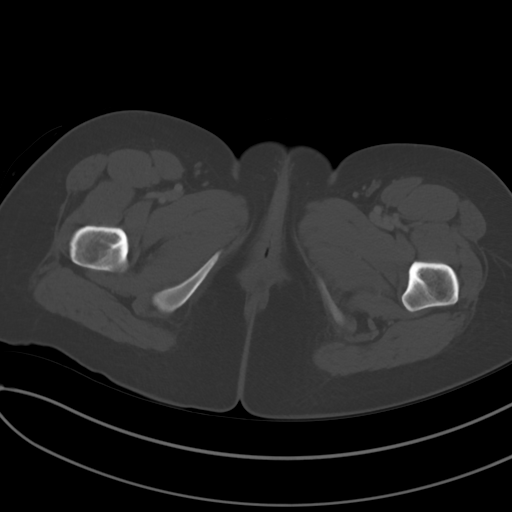
[im 14/94  soft-tissue]
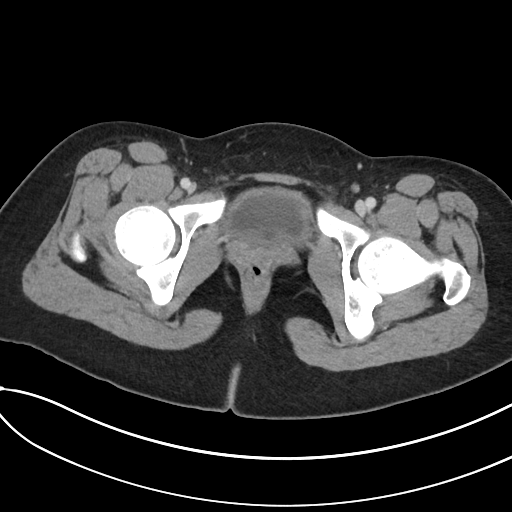
[im 18/94  soft-tissue]
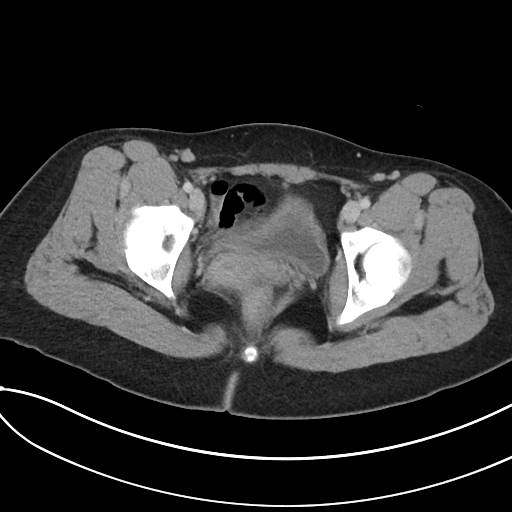
[im 27/94  soft-tissue]
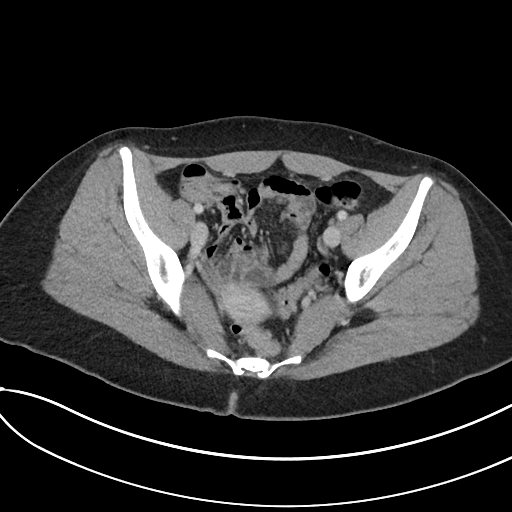
[im 32/94  soft-tissue]
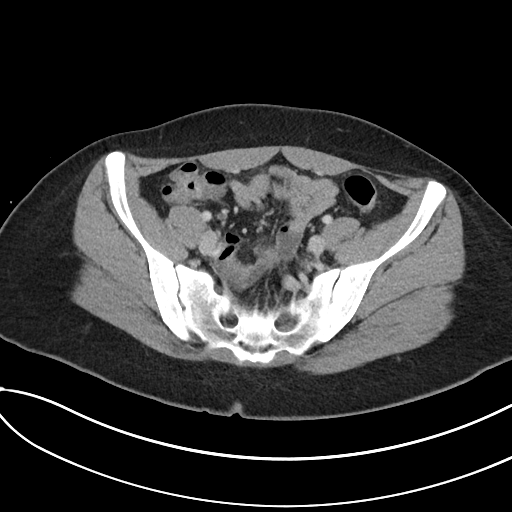
[im 40/94  soft-tissue]
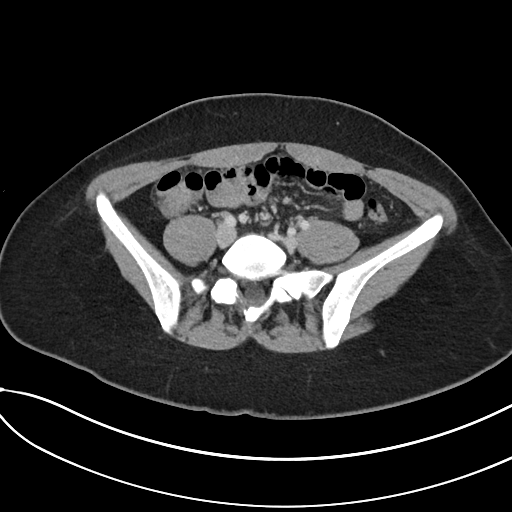
[im 49/94  soft-tissue]
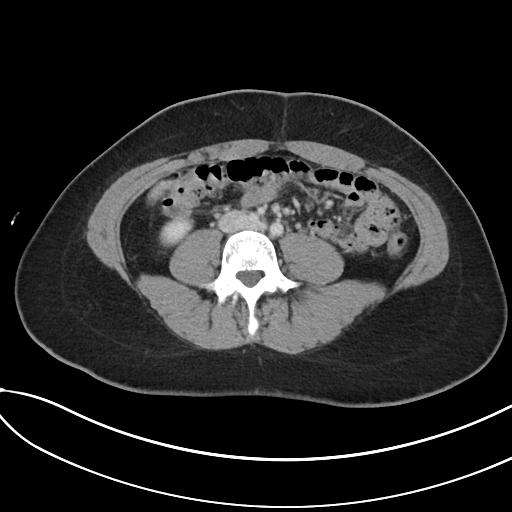
[im 54/94  soft-tissue]
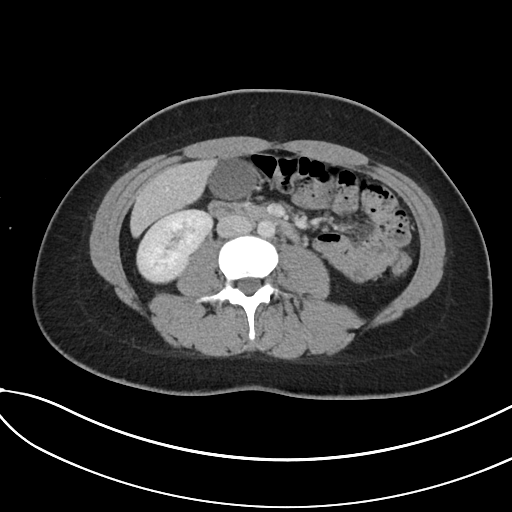
[im 63/94  soft-tissue]
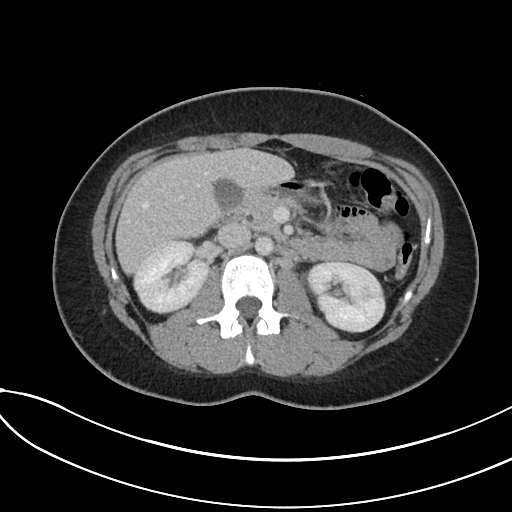
[im 63/94  bone]
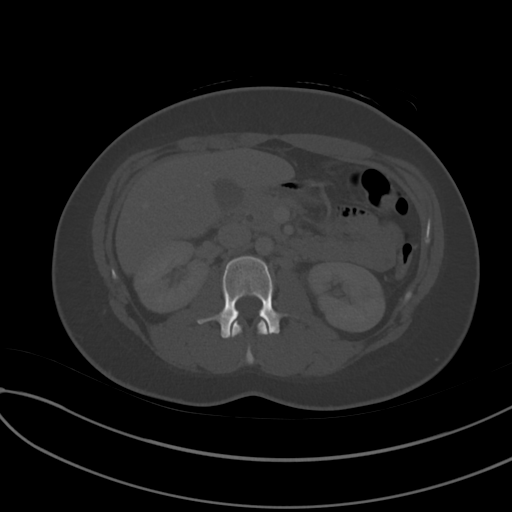
[im 67/94  soft-tissue]
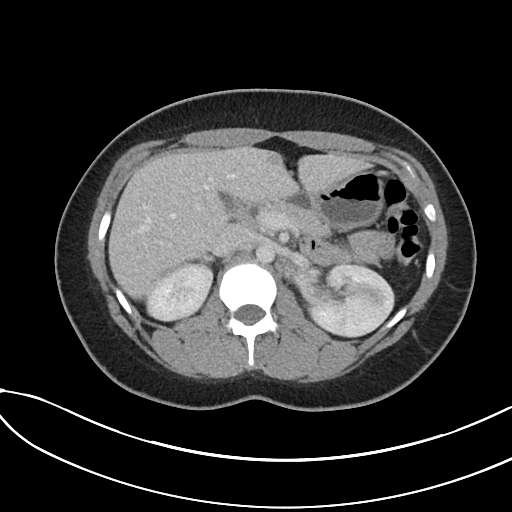
[im 76/94  soft-tissue]
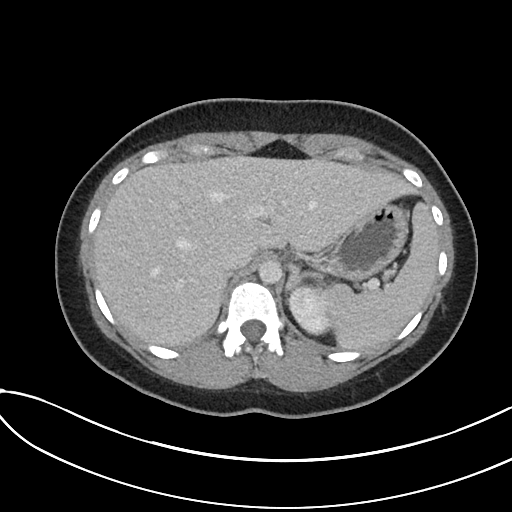
[im 80/94  soft-tissue]
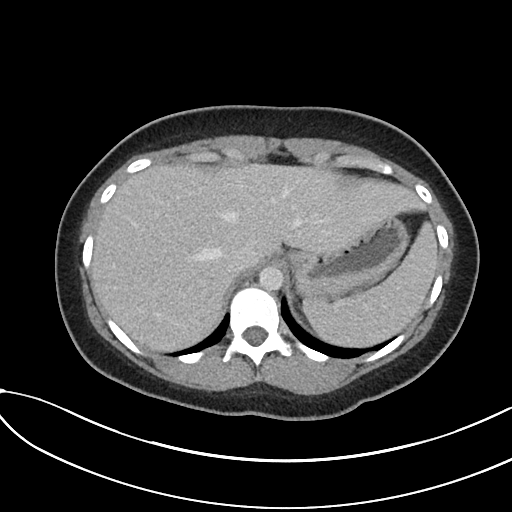
[im 89/94  soft-tissue]
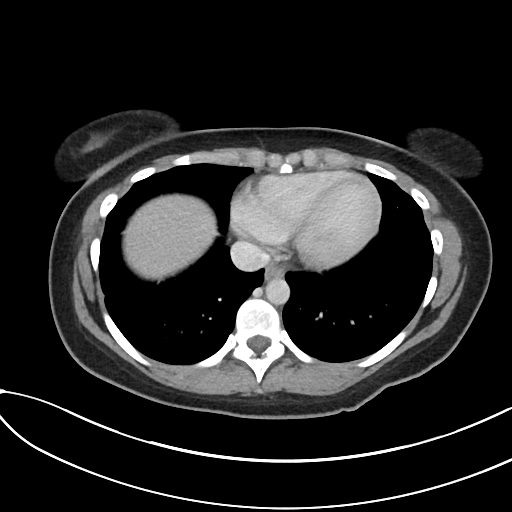

[Series 6: abdomen 3.0 mpr cor · coronal · 0.86mm/px · 3 of 85 slices shown]
[im 29/85  soft-tissue]
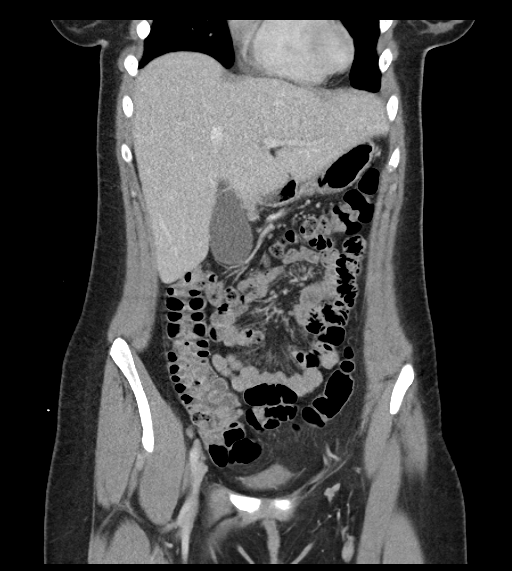
[im 38/85  soft-tissue]
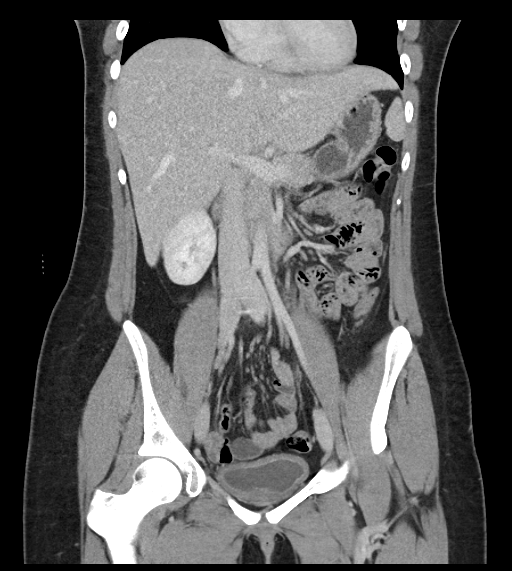
[im 47/85  soft-tissue]
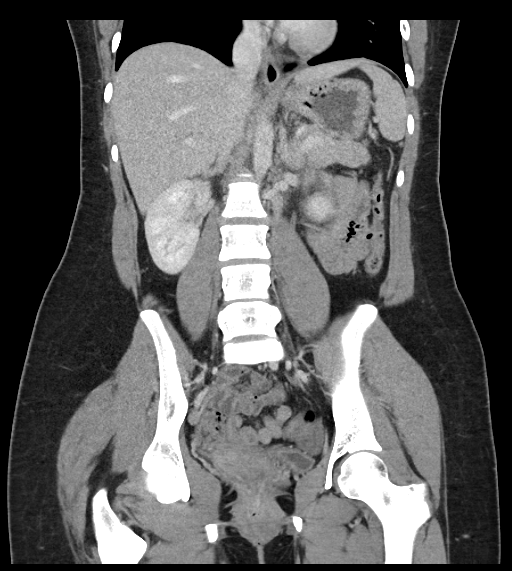

[16 of 46 positions shown; findings below may reference images not displayed]

FINDINGS: Lower chest: Minimal atelectasis at the lung bases. Normal heart
size without pericardial effusion.

Hepatobiliary: No focal liver abnormality is seen. No gallstones,
gallbladder wall thickening, or biliary dilatation.

Pancreas: Unremarkable. No pancreatic ductal dilatation or
surrounding inflammatory changes.

Spleen: Normal in size without focal abnormality.

Adrenals/Urinary Tract: In homogeneous enhancement of the left
kidney with striated appearance compatible with changes of acute
pyelonephritis. No definite evidence of lobar nephronia. No
nephrolithiasis, enhancing renal mass nor hydroureteronephrosis.
Mild mucosal enhancement of the urinary bladder some which could be
due to underdistention but suspect a component of cystitis.

Stomach/Bowel: Stomach is within normal limits. Appendix is not
confidently identified but no pericecal inflammation to suggest
acute appendicitis. No evidence of bowel wall thickening,
distention, or inflammatory changes.

Vascular/Lymphatic: No significant vascular findings are present. No
enlarged abdominal or pelvic lymph nodes.

Reproductive: Uterus and bilateral adnexa are unremarkable.

Other: No abdominal wall hernia or abnormality. No abdominopelvic
ascites.

Musculoskeletal: No acute or significant osseous findings.
IMPRESSION: 1. Striated appearance of the left kidney compatible with acute
pyelonephritis. No definite evidence of lobar nephronia.
2. Mild mucosal enhancement of the urinary bladder some which could
be due to underdistention but suspect a component of cystitis.
